# Patient Record
Sex: Male | Born: 1945 | Race: White | Hispanic: No | Marital: Married | Smoking: Former smoker
Health system: Southern US, Community
[De-identification: ages and names within clinical notes are randomized; demographics above are authoritative.]

## PROBLEM LIST (undated history)

## (undated) DIAGNOSIS — I2510 Atherosclerotic heart disease of native coronary artery without angina pectoris: Secondary | ICD-10-CM

## (undated) DIAGNOSIS — I219 Acute myocardial infarction, unspecified: Secondary | ICD-10-CM

## (undated) DIAGNOSIS — H269 Unspecified cataract: Secondary | ICD-10-CM

## (undated) DIAGNOSIS — K219 Gastro-esophageal reflux disease without esophagitis: Secondary | ICD-10-CM

## (undated) DIAGNOSIS — IMO0001 Reserved for inherently not codable concepts without codable children: Secondary | ICD-10-CM

## (undated) DIAGNOSIS — R350 Frequency of micturition: Secondary | ICD-10-CM

## (undated) DIAGNOSIS — R351 Nocturia: Secondary | ICD-10-CM

## (undated) DIAGNOSIS — M1990 Unspecified osteoarthritis, unspecified site: Secondary | ICD-10-CM

## (undated) DIAGNOSIS — R609 Edema, unspecified: Secondary | ICD-10-CM

## (undated) DIAGNOSIS — R200 Anesthesia of skin: Secondary | ICD-10-CM

## (undated) HISTORY — PX: CARDIAC CATHETERIZATION: SHX172

## (undated) HISTORY — DX: Acute myocardial infarction, unspecified: I21.9

## (undated) HISTORY — PX: CARDIAC SURGERY: SHX584

## (undated) HISTORY — PX: OTHER SURGICAL HISTORY: SHX169

## (undated) HISTORY — PX: HERNIA REPAIR: SHX51

## (undated) HISTORY — PX: CORONARY ARTERY BYPASS GRAFT: SHX141

---

## 1999-02-09 ENCOUNTER — Emergency Department (HOSPITAL_COMMUNITY): Admission: EM | Admit: 1999-02-09 | Discharge: 1999-02-10 | Payer: Self-pay | Admitting: Emergency Medicine

## 1999-02-10 ENCOUNTER — Encounter: Payer: Self-pay | Admitting: Emergency Medicine

## 2002-04-16 ENCOUNTER — Encounter: Payer: Self-pay | Admitting: *Deleted

## 2002-04-16 ENCOUNTER — Inpatient Hospital Stay (HOSPITAL_COMMUNITY): Admission: EM | Admit: 2002-04-16 | Discharge: 2002-04-23 | Payer: Self-pay | Admitting: Emergency Medicine

## 2002-04-17 ENCOUNTER — Encounter: Payer: Self-pay | Admitting: Cardiothoracic Surgery

## 2002-04-18 ENCOUNTER — Encounter: Payer: Self-pay | Admitting: Cardiothoracic Surgery

## 2002-04-20 ENCOUNTER — Encounter: Payer: Self-pay | Admitting: Cardiothoracic Surgery

## 2002-04-21 ENCOUNTER — Encounter: Payer: Self-pay | Admitting: Cardiothoracic Surgery

## 2002-04-22 ENCOUNTER — Encounter: Payer: Self-pay | Admitting: Cardiothoracic Surgery

## 2003-07-14 ENCOUNTER — Inpatient Hospital Stay (HOSPITAL_COMMUNITY): Admission: EM | Admit: 2003-07-14 | Discharge: 2003-07-16 | Payer: Self-pay | Admitting: Emergency Medicine

## 2003-08-20 ENCOUNTER — Emergency Department (HOSPITAL_COMMUNITY): Admission: EM | Admit: 2003-08-20 | Discharge: 2003-08-20 | Payer: Self-pay

## 2004-02-14 ENCOUNTER — Emergency Department (HOSPITAL_COMMUNITY): Admission: EM | Admit: 2004-02-14 | Discharge: 2004-02-14 | Payer: Self-pay | Admitting: Emergency Medicine

## 2005-06-02 ENCOUNTER — Encounter: Admission: RE | Admit: 2005-06-02 | Discharge: 2005-06-02 | Payer: Self-pay | Admitting: Internal Medicine

## 2007-12-12 ENCOUNTER — Ambulatory Visit (HOSPITAL_COMMUNITY): Admission: RE | Admit: 2007-12-12 | Discharge: 2007-12-12 | Payer: Self-pay | Admitting: Cardiovascular Disease

## 2009-04-18 ENCOUNTER — Emergency Department (HOSPITAL_COMMUNITY): Admission: EM | Admit: 2009-04-18 | Discharge: 2009-04-19 | Payer: Self-pay | Admitting: Emergency Medicine

## 2010-09-19 NOTE — H&P (Signed)
NAME:  Timothy Cruz, Timothy Cruz                        ACCOUNT NO.:  [id number]   MEDICAL RECORD NO.:  [id number]                   PATIENT TYPE:  INP   LOCATION:  2921                                 FACILITY:  [facility]   PHYSICIAN:  Charlton Haws, M.D. [facility]              DATE OF BIRTH:  09/28/1945   DATE OF ADMISSION:  04/16/2002  DATE OF DISCHARGE:                                HISTORY & PHYSICAL   HISTORY OF PRESENT ILLNESS:  The patient is a 65 year old white male who  presents to the ER with 14 hours of constant substernal chest pressure,  nonradiating, associated with nausea and diaphoresis. It started at 2 p.m.  while he was watching TV.  He describes it as a tightness. He denies any  palpitations, shortness of breath, dizziness or syncope. He has never had  similar chest pain, but he does speak like he had an MI back in 1984,  although catheterization was normal by his report. He does not have a  primary doctor. He does smoke a greater than 50 pack year history. He has a  family history of heart disease in his grandmother.   PAST MEDICAL HISTORY:  1. Questionable MI in 1984.  2. Left inguinal hernia repair in 2000.   MEDICATIONS:  None.   ALLERGIES:  CODEINE.   FAMILY HISTORY:  He says that his mother has some type of heart disease but  he is not sure what, in her early  2s, but never had angioplasty or bypass.   SOCIAL HISTORY:  He is married. He has no children. 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 He does have a greater than 50 pack year  smoking history. He denies any alcohol or drug use.   REVIEW OF SYMPTOMS:  Negative for CNS, negative for GI, negative for GU,  negative for derm, negative for psych. Positive for muscle cramps in his  lower extremities and positive for wearing glasses.   PHYSICAL EXAMINATION:  VITAL SIGNS:  Blood pressure 117/77, heart rate 65,  respiratory rate 18, afebrile; this was after IV Lopressor.  GENERAL:  He is lying  comfortably in bed, in no acute distress.  NECK:  No JVP, no hepatojugular reflux, no carotid bruits.  LUNGS:  Inspiratory crackles bilaterally, approximately half way up with no  presacral edema.  CARDIAC:  His PMI is nondisplaced. He has a normal S1, S2, regular rate and  rhythm, positive soft S4, no murmurs, clicks or rubs appreciated.  ABDOMEN:  Soft, nontender, nondistended, no hepatosplenomegaly.  EXTREMITIES:  There is a decreased right femoral pulse, no bruits, 2+ distal  pulses, cool extremities with no cyanosis, clubbing or edema.  RECTAL:  Guaiac negative brown stool.   LABORATORY DATA:  Currently pending. Creatinine is 1.2.   An EKG  shows normal sinus rhythm, heart rate 70 with Q-waves  anteroseptally. An anterior ST elevation with a peak of  6 mm in V1 through  V4 and 1 and AVL. His chest x-ray shows mild cephalization but no acute  infiltrate.   ASSESSMENT AND PLAN:  This is a 65 year old white male with no significant  past medical history other  than a questionable  MI in 1984, positive  tobacco use, who presents with an  ST elevation MI 14 hours after onset of  chest pain and has Q waves anteroseptally with marked ST elevation. Due to  his persistent pain we will plan on doing an emergent cardiac  catheterization despite it being greater than 12 hours from onset. Will  initially treat with IV Metoprolol, hyper and glycoprotein 2B3A, aspirin,  ACE inhibitor, statin and nitroglycerin. Will start Plavix pending his  catheterization and encourage smoking cessation.                                               Charlton Haws, M.D. [facility]    PN/MEDQ  D:  04/16/2002  T:  04/16/2002  Job:  [id number]

## 2010-09-19 NOTE — Cardiovascular Report (Signed)
NAME:  Timothy Cruz, Timothy Cruz                        ACCOUNT NO.:  [id number]   MEDICAL RECORD NO.:  [id number]                   PATIENT TYPE:  INP   LOCATION:  2921                                 FACILITY:  [facility]   PHYSICIAN:  Arturo Morton. Riley Kill, M.D. [facility]         DATE OF BIRTH:  09/14/1945   DATE OF PROCEDURE:  04/16/2002  DATE OF DISCHARGE:                              CARDIAC CATHETERIZATION   INDICATIONS FOR PROCEDURE:  This patient is a 65 year old gentleman who  presents to the emergency room with an acute anterior wall myocardial  infarction.  He was seen by Dr. Theodis Aguas and subsequently set up for emergent  cardiac catheterization.  He had the onset of chest pain at 2 p.m., and the  chest pain lingered throughout the day. He finally decided to come to the  emergency room shortly after 6 a.m. this morning.  An EKG was diagnostic of  ST elevation and myocardial infarction.   PROCEDURE:  1. Left heart catheterization.  2. Selective coronary arteriography.  3. Selective left ventriculography.  4. Aortic root aortography.  5. Subclavian angiography.   DESCRIPTION OF PROCEDURE:  The patient was brought to the catheterization  laboratory and prepped and draped in the usual fashion.  Through an anterior  puncture, the right femoral artery was easily entered.  A 7 French sheath  was placed.  Views of the left and right coronary artery were then obtained  with multiple angiographic projections.  The central aortic and left  ventricular pressures were measured.  Ventriculography was performed in the  RAO projection.  Following a pressure pull-back, proximal root aortography  was performed.  We then performed subclavian angiography in two views.  Following this, the diagnostic catheters were removed.  A repeat EKG was  obtained to assess ST resolution.  Importantly, the patient had relief of  symptoms and was pain-free at the time of the beginning of the procedure.  EKG revealed some ST  resolution, although not complete.  The catheterization  study did demonstrate TIMI-3 flow in the infarct-related artery.  Because of  the location of the ostial location of the LAD, it was felt that stenting of  the ostium had likely compromised the circumflex take-off.  I then summoned  Dr. Rexanne Mano to the catheterization laboratory from the cardiovascular  and thoracic surgery team, and we reviewed the films.  It was felt that the  best approach would be to recommend revascularization surgery with a fairly  short period of convalescence to allow for some recovery of myocardial  function.  The femoral sheath then was sewn into place after removal of all  catheters.  He was taken to the coronary care unit in satisfactory clinical  condition with resolution of chest pain.  The plan will be for semi-urgent  revascularization surgery.   HEMODYNAMIC DATA:  1. Central aortic pressure 115/80, mean 95.  2. Left ventricle 117/14/27.  3. No gradient and pull-back  across the aortic valve.   ANGIOGRAPHIC DATA:  1. Ventriculography was performed in the RAO projection.  Ejection fraction     was calculated at 37%.  There was near akinesis of the mid and distal     anterolateral wall, apex of the distal inferior wall.  There was trace     mitral regurgitation.  2. The proximal root was injected to rule out dissection and aortic     regurgitation.  The patient had some hang-up of contrast in the left     ventricle during ventriculography, and we wanted to rule out AI in case a     balloon pump was necessary and also as a preoperative measure.  No aortic     regurgitation was noted.  3. The left main coronary artery has about 20% segmental plaquing at the     ostium, extending up to just before the bifurcation. The vessel then     opens up.  4. The left anterior descending artery courses to the apex.  There are two     diagonal branches.  There is a high-grade ostial stenosis with some      haziness, compatible with thrombus at the ostium of the LAD.  This was     95% blocked.  There was TIMI-3 flow.  There was a tiny first diagonal     with about 80% eccentric narrowing and a second diagonal which has about     60% ostial narrow.  The mid-AD is somewhat segmentally plaqued with about     40-50% narrowing at its first point. The distal vessel does appear to be     suitable for grafting.  5. There is a tiny ramus intermedius vessel that has some segmental plaquing     at the ostium.  6. The circumflex proper provides two large marginal branches.  The first     one has a 50% ostial eccentric stenosis.  The second one in the AV     portion of the vessel has a 50% segmental stenosis.  7. The right coronary artery is a dominant vessel, providing a posterior     descending and posterolateral system. There is minimal luminal     irregularity but no focal area stenosis in the RCA.  8. The subclavian demonstrates some folding in the midportion of the     subclavian prior to the vertebral and mammary takeoff.  This does not     appear to more than about 40-50%.  There is a good runoff into the distal     vessel.  The mammary itself appears to be widely patent.   CONCLUSIONS:  1. Moderate reduction of left ventricular function with an anterior wall     motion abnormality.  2. 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  3. Other findings as noted above.   DISPOSITION:  Dr. Laneta Simmers and I have reviewed the films together in detail.  Because of the ostial location of the LAD stenosis, it was felt that  stenting would likely compromise the circumflex takeoff.  Given the anatomic  findings, our leaning is in the direction of consideration of revascularization surgery.  Dr. Laneta Simmers is seeing the patient, and plans will  be made for semi-urgent  revascularization surgery after a period of  myocardial stabilization, as long as the patient continues  to be pain-free  and without new ST elevation.                                               Arturo Morton. Riley Kill, M.D. [facility]    TDS/MEDQ  D:  04/16/2002  T:  04/16/2002  Job:  [id number]

## 2010-09-19 NOTE — Discharge Summary (Signed)
NAME:  Timothy Cruz, Timothy Cruz                        ACCOUNT NO.:  [id number]   MEDICAL RECORD NO.:  [id number]                   PATIENT TYPE:  INP   LOCATION:  2023                                 FACILITY:  [facility]   PHYSICIAN:  Gwenith Daily. Tyrone Sage, M.D.            DATE OF BIRTH:  05/17/1945   DATE OF ADMISSION:  04/16/2002  DATE OF DISCHARGE:  04/23/2002                                 DISCHARGE SUMMARY   ADMISSION DIAGNOSIS:  Acute anterior myocardial infarction.   SECONDARY DIAGNOSES:  1. Postoperative admission secondary to blood loss  2. Right-sided pneumothorax.   DISCHARGE DIAGNOSIS:  Coronary artery disease.   HOSPITAL COURSE:  The patient was admitted to [facility] on April 16, 2002, after  experiencing acute anterior myocardial infarction. He underwent cardiac  catheterization at this time. Dr. Tyrone Sage was subsequently consulted. On  April 18, 2002, Dr. Tyrone Sage performed a coronary bypass graft x 4 with  left internal mammary artery anastomosis, left anterior descending artery  radiating up to the diagonal artery and sequential saphenous vein graft to  the first and second obtuse marginal arteries. No complications are noted  during procedure. Postoperatively the patient was anticoagulated with  Coumadin secondary to his anterior myocardial infarction.  His postoperative  course was complicated by 60% right-sided pneumothorax following his  mediastinal chest tube removal. Because of this, Dr. Tyrone Sage performed  reinsertion of a #28 French right-sided chest tube. Chest tube is  discontinued two days later after chest x-ray revealed he had no  pneumothorax, and he had no air leak on water seal. The remainder of his  postoperative course was uneventful, and he was subsequently deemed stable  for discharge home on April 23, 2002.   DISCHARGE MEDICATIONS:  1. Altace 2.5 mg twice daily.  2. Toprol XL 25 mg one daily.  3. Pepcid 20 mg 1 twice daily.  4.  Ultram 50 mg 1-2 tabs every 4-6 hours for pain.  5. Coumadin 5 mg. The patient will take one daily or as directed by his     cardiologist.   DISCHARGE ACTIVITIES:  The patient was told no driving, strenuous activity, lifting heavy objects.   DIET:  Low fat, low salt.   DISCHARGE INSTRUCTIONS:  The patient was told he could shower and clean the incision with soap and  water.   DISPOSITION/FOLLOWUP:  The patient was told to call his cardiologist, Dr.  Corinda Gubler, for a two-week followup appointment. In addition, he is told to  have his INR checked in 2 to 3 days from discharge by his cardiologist. He  is also to see Dr. Tyrone Sage at his office on Thursday, February 5 at 11:10  a.m.  Told to bring chest x-ray with him.     Levin Erp. Steward, P.A.                      Gwenith Daily Tyrone Sage, M.D.  BGS/MEDQ  D:  04/22/2002  T:  04/24/2002  Job:  [id number]

## 2010-09-19 NOTE — Consult Note (Signed)
NAME:  Timothy Cruz, Timothy Cruz                        ACCOUNT NO.:  [id number]   MEDICAL RECORD NO.:  [id number]                   PATIENT TYPE:  INP   LOCATION:  3011                                 FACILITY:  [facility]   PHYSICIAN:  Casimiro Needle L. Thad Ranger, M.D.           DATE OF BIRTH:  12-27-1945   DATE OF CONSULTATION:  07/15/2003  DATE OF DISCHARGE:  07/16/2003                                   CONSULTATION   PRIMARY CARE PHYSICIAN:  Joycelyn Rua, M.D.   REASON FOR EVALUATION:  Visual changes on left side.   HISTORY OF PRESENT ILLNESS:  This is the initial inpatient consultation  evaluation of this 65 year old male with medical history including coronary  artery disease.  Admitted for workup of transient focal neurologic symptoms.  The patient reports that on Friday evening he was sitting in a cafe with his  wife, when he says that his vision got blurry.  He apparently had initially  reported that vision was blurry out of both eyes; however, he tells me very  distinctly today that the vision became blurry in his left eye only.  In any  case, he went to wash his eyes out with water and says that his vision  actually cleared up after a few minutes.  There was no associated weakness,  numbness or pain with this. He subsequently left the restaurant with this  wife and on the way home developed a numb sensation involving the entire  left upper extremity, sparing the face and lower extremity.  There was no  associated and he denied any associated headache or return of the visual  changes.  At that point he came to the emergency room, by which time the  left arm numbness had resolved.  However, by this time he had developed a  headache, which he described as throbbing in nature and located in the  front; describing it as like my eyes coming out of my head.  He has  specifically had these headaches before.  He is subsequently admitted for  evaluation and further workup, including MRI and MRA (which  was performed  today) and is available for my review.  He denied any further neurologic  symptoms in the hospital.  He has, however, on each of the last two days had  a headache similar in character to the initial event, which has been brief  and relatively well relieved by Tylenol.  Neurological consultation is  requested for further considerations about all of events.   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  He has a history of hypercholesterolemia  (on Lipitor).  On questioning he does report a nearly lifelong history of  severe headaches, which are throbbing in character, bifrontal, lasting 2-3  hr, fairly severe, sometimes associated with photophobia and often relieved  by vomiting.   FAMILY, SOCIAL AND REVIEW OF SYSTEMS:  As outlined in the initial H&P by  [name], M.D. and in the admission nursing records.   MEDICATIONS:  Prior to admission he was taking aspirin, Altace, Lipitor,  Toprol XL, Pepcid AC, Omega 3 fish oil.  In the hospital Bextra and Toprol  have been discontinued, the latter due to bradycardia.  The aspirin has been  changed to Plavix.   ALLERGIES:  CODEINE.   PHYSICAL EXAMINATION:  VITAL SIGNS:  Temperature 97.2, blood pressure  134/82, pulse 65, respirations 20, O2 saturations 98% on room air.  GENERAL EXAMINATION:  This is healthy-appearing man, lying supine on the  hospital bed in no evident distress.  HEENT:  Cranium is normocephalic and atraumatic.  Oropharynx is benign.  NECK:  Supple without carotid bruits.  HEART:  Regular rate and rhythm without murmurs.  NEUROLOGIC:  Mental Status:  He is awake, alert and oriented to time, place  and person.  Recent and remote memory are intact.  Attention span,  concentration and fund of knowledge are appropriate.  The patient is fluent  and not dysarthric.  Mood is euthymic and affect appropriate.  Pupils equal  and physically  active.  Extraocular movements are full without nystagmus.  Visual fields are full to confrontation.  Face, tongue and palate move  normally and symmetrically.  The patient's face is sensitive to pinprick.  Shoulder shrug strength is normal.  Motor and sensory are normal bulk and  tone.  Normal strength in all extremity muscles.  Sensation intact to  pinprick and light touch in all extremities.  Coordination of rapid  movements are performed well.  Heel-to-shin and finger-to-nose were  performed well.  Gait:  He arises from a chair easily and his stance is  normal.  He is able to heel-toe and tandem walk without difficulty.  Reflexes are 2+ and symmetric.  Toes are downgoing.   LABORATORY REVIEW:  Admission CBC is unremarkable.  BMET x2 is unremarkable.  Fasting homocysteine level from this morning is in the normal range at  11.09.  Fasting lipid panel is pending at this time.   MRI and MRA of the brain performed earlier today is personally reviewed.  The MRI of the brain is basically entirely unremarkable.  The MRA does not  demonstrate a significant vascular disease.  There is minimal left siphon  disease versus artifact, but nothing critical.   IMPRESSION:  1. Transient neurologic symptoms of visual alteration and left hemi     numbness.  This, very likely, was a migraine and given that he does     clearly have migraine history.  However, in a patient with known coronary     artery disease, TIA must also be a strong consideration.  2. Common migraine, see above.  3. Noncritical left carotid disease.  4. History of coronary artery disease.  5. History of hyperlipidemia.   RECOMMENDATIONS:  Carotid Doppler and echocardiogram have been ordered.  Will also order transcranial dopplers and MRA of the neck to evaluate the  vertebral arteries.  He would also benefit from a migraine prophylactic; a  beta blocker would be ideal but his beta blocker at present has actually been held due to  bradycardia.  Will consider starting an anticonvulsant such  as Depakote and Topamax.  Will follow with these.  Michael L. Thad Ranger, M.D.    MLR/MEDQ  D:  07/15/2003  T:  07/17/2003  Job:  [id number]   cc:   Joycelyn Rua, M.D.  [address] [place]  [place] [address]  Fax: [phone]

## 2010-09-19 NOTE — Discharge Summary (Signed)
NAME:  Timothy Cruz, Timothy Cruz                        ACCOUNT NO.:  [id number]   MEDICAL RECORD NO.:  [id number]                   PATIENT TYPE:  INP   LOCATION:  3011                                 FACILITY:  [facility]   PHYSICIAN:  [name], M.D.             DATE OF BIRTH:  1946/01/07   DATE OF ADMISSION:  07/14/2003  DATE OF DISCHARGE:  07/16/2003                                 DISCHARGE SUMMARY   DISCHARGE DIAGNOSES:  1. Transient focal neurologic symptoms presumed secondary to complicated     migraine.  2. History of headaches.  3. History of coronary artery disease, status post coronary artery bypass     graft.  4. History of gastroesophageal reflux.  5. Dyslipidemia.   DISCHARGE MEDICATIONS:  1. The patient is going to resume his preadmission medications which     included:  a  Toprol.  b  Pepcid.  c  Altace.  d  Aspirin.  e.  His dose of Lipitor has been increased from 20 mg daily to 30 mg daily.  1. He will be using over-the-counter Tylenol for pain control; this seems to     be working for him for his occipital headache so far as needed.  2. A new medicine is Zofran 4 mg every 6 hours as needed for nausea.   ACTIVITY:  Activity is as tolerated.   DIET:  Diet will be a cardiac diet.   FOLLOWUP:  The patient is to follow up with Dr. Janalyn Shy P. Sethi in 3-4 weeks  as planned.   DISCHARGE LABORATORIES:  WBC count 7.6, hemoglobin 14.6, hematocrit 43.0,  platelet count 245,000, MCV 85.6.  Sodium 134, potassium 4.2, chloride 108,  CO2 27, glucose 97, BUN 10, creatinine 1.1, calcium 8.5, total cholesterol  175, triglycerides 112, HDL 13, LDL 123.  Homocysteine level is 11.09.   REASON FOR HOSPITALIZATION:  Acute visual change with left hand numbness, to  rule out posterior stroke; transient ischemic attack, rule out posterior  circulation stroke.   The patient presented with acute left eye visual blurring with numbness of  his left hand the day prior to admission,  lasting no more than 15 minutes,  with severe occipital headaches and some nausea.  At the ED, the patient had  a CT scan done which was negative.  The ED exam did not reveal any focal  deficit and we were called to admit the patient for evaluation for possible  transient ischemic attack.  At time of admission, the patient's hemodynamics  were within normal limits.  He was not in acute cardiopulmonary distress.  He was alert and oriented x3 without any acute focal deficit.  His lungs  were clear to auscultation.  Cardiac exam was notable for regular rate and  rhythm without any gallops or murmur and there was no edema on exam of his  extremities.  Admitting CBC was within normal limits and chemistries were  also within normal limits.  EKG was notable for sinus bradycardia at 54  beats per minute without any acute ST wave changes.  The patient was  therefore admitted for probable TIA to rule out posterior circulation  stroke.   HOSPITAL COURSE:  The patient was admitted to telemetry monitoring.  There  were no significant consistent dysrhythmias.  He had a mild bradycardic  episode 24 hours of admission, which was during the time of sleep and was  not symptomatic, and resolved spontaneously.  The patient was switched from  aspirin to Plavix and MRI was obtained which was negative for any acute  stroke.  MRA indicated some mild left internal carotid artery stenosis.  Homocysteine level was done which was within normal limits.  Carotid Doppler  was also done which showed bilateral mild plaque noted throughout without  any internal carotid artery stenosis.  The patient, in the hospital,  continued to have some severe occipital headaches which resolved with  Tylenol.  The patient was seen in consultation by Dr. Kelli Hope of  neurology yesterday and his impression was that these symptoms were related  to complicated migraines, however, he thought that TIA might be strongly  considered.   He recommended neck MRA and transcranial Dopplers.  Today, the  patient was seen with Dr. Sunny Schlein. Sethi at bedside, who had reviewed the  MRA, and the patient is deemed appropriate for discharge today and Dr. Pearlean Brownie  thinks that the patient does not need any further stroke workup and he  strongly believes that the patient's symptoms were related to complicated  migraines.  He also noted that this patient had been having headaches like  once every month or so.  The patient does not need any migraine prophylaxis  for now and that he was going to see the patient in the office in about a  month and follow him up in this regard.  He, however, recommended increasing  the dose of his Lipitor from 20 mg to 30 mg daily.  Two-dimensional echo was  ordered as part of the workup for stroke on admission and it was not done  prior to discharge and it is deemed unnecessary at this point.  Today, at  rounds with Dr. Pearlean Brownie, the patient complains of some nausea, headache is  improved, he does not have any chest pain, shortness of breath,  palpitations, dyspnea on exertion, paroxysmal nocturnal dyspnea or  orthopnea.  His BP was 129/80, pulse rate of 75, respiratory rate of 20,  temperature of 98.2 degrees Fahrenheit, saturation of 94% on room air.  On  general examination, he was not in acute cardiopulmonary distress.  On  neurologic exam, pupils were equal, round and reactive to light.  Extraocular movements were intact.  There were no visual deficits and  neurologically intact.  Neck was supple with no JVD.  Lungs were clear to  auscultation bilaterally.  Cardiac exam was notable for regular rate and  rhythm without any gallops or murmur.  His telemetry showed sinus rhythm  overnight.  He does not have any edema of his extremities.  Abdomen is soft  and nontender.  Normoactive bowel sounds were appreciated.  The patient is going to be discharged home today in satisfactory condition.   DISCHARGE PLANS AND  INSTRUCTIONS:  We have opted to give him Zofran for  symptomatic control of headache-associated nausea.  The patient is to follow  up with his primary care physician as stated, routinely.  He is going  to  follow up with Dr. Pearlean Brownie, as mentioned above, for management of his  migraines and I have asked him to return to the office or to notify his M.D.  should he have any problems including any persistent visual changes or  extremity weakness.                                                [name], M.D.    GO/MEDQ  D:  07/16/2003  T:  07/18/2003  Job:  [id number]   cc:   Casimiro Needle L. Thad Ranger, M.D.  [address]  [address]  [place]  [place] [address]  Fax: [phone]   Pramod P. Pearlean Brownie, MD  Fax: [phone]   Joycelyn Rua, M.D.  [address] [place]  [place] [address]  Fax: [phone]

## 2010-09-19 NOTE — Op Note (Signed)
NAME:  Timothy Cruz, Timothy Cruz                        ACCOUNT NO.:  [id number]   MEDICAL RECORD NO.:  [id number]                   PATIENT TYPE:  INP   LOCATION:  2023                                 FACILITY:  [facility]   PHYSICIAN:  Gwenith Daily. Tyrone Sage, M.D.            DATE OF BIRTH:  01-27-1946   DATE OF PROCEDURE:  04/18/2002  DATE OF DISCHARGE:                                 OPERATIVE REPORT   PREOPERATIVE DIAGNOSIS:  Acute anterior myocardial infarction and coronary  occlusive disease.   POSTOPERATIVE DIAGNOSIS:  Acute anterior myocardial infarction and coronary  occlusive disease.   PROCEDURES:  1. Coronary artery bypass grafting x4 with the left internal mammary artery     to the left anterior descending coronary artery, reversed saphenous vein     graft to the diagonal coronary artery, sequential reversed saphenous vein     graft to the first and second obtuse marginals.  2. Right endovein harvesting.   SURGEON:  Gwenith Daily. Tyrone Sage, M.D.   ASSESSMENT:  Timothy Cruz, P.A.   BRIEF HISTORY:  The patient is a 65 year old male with a strong smoking  history, who presents with an episode of prolonged chest pain.  At the time  of his admission, he had EKG changes and troponin and CK-MB consistent with  myocardial infarction.  He was stabilized medically and underwent cardiac  catheterization by Arturo Morton. Riley Kill, M.D., which demonstrated a high-grade  diffuse, long stenosis of the proximal LAD and diagonal branch.  In  addition, there was 60% stenosis of the first and second obtuse marginals.  The right coronary artery had luminal irregularities but without high-grade  stenosis.  Because of the patient's critical anatomy and anatomy unfavorable  for successful angioplasty, coronary artery bypass grafting was recommended.  The patient agreed and signed informed consent.   DESCRIPTION OF PROCEDURE:  With Swan-Ganz and arterial line monitors in  place, the patient underwent general  endotracheal anesthesia without  incident.  The skin of the chest and legs was prepped with Betadine and  draped in the usual sterile manner.  Endovein was harvested from the right  thigh but was very small and not suitable for bypass.  Additional vein was  harvested from each lower extremity and was of good quality and caliber.  Median sternotomy was performed.  The left internal mammary artery was  dissected down as a pedicle graft.  The distal artery had good, free flow.  The pericardium was opened.  The patient had evidence of LV dysfunction with  hypokinesis of the anterior ventricular wall.  He was systemically  heparinized.  The ascending aorta and the right atrium were cannulated, and  the aortic root vent cardioplegia needle was introduced into the ascending  aorta.  The patient was placed on cardiopulmonary bypass, 2.4 L/min. per sq.  m.  Sites of anastomosis were dissected out of the epicardium.  The  patient's body temperature was cooled to  30 degrees, the aortic crossclamp  was applied, and 500 cc of cold blood potassium cardioplegia was  administered with rapid diastolic arrest of the heart.  Myocardial septal  temperature was monitored throughout the crossclamp period.  Attention was  turned first to the first and second obtuse marginal vessels.  Each was  opened and admitted a 1.5 mm probe.  Using a diamond-type side-to-side  anastomosis, a segment of reversed saphenous vein was anastomosed to the  first obtuse marginal.  The distal extent of the same vein was then carried  a short distance to the second obtuse marginal, where an end-to-side  anastomosis was carried out with a running 7-0 Prolene.  Attention was then  turned to the diagonal coronary artery, which was opened and was 1.3-1.4 mm  in size.  Using a running 7-0 Prolene, distal anastomosis was performed to  the second reversed saphenous vein graft.  The left anterior descending  coronary artery was then opened in  the mid- to distal portion.  It admitted  a 1.5 mm probe proximally and distally.  Using a running 8-0 Prolene, the  left internal mammary artery was anastomosed to the left anterior descending  coronary artery.  With release of the Edwards bulldog on the mammary artery,  there was appropriate rise in myocardial septal temperature, and the aortic  crossclamp was removed.  Total crossclamp time 53 minutes.  The patient  required electrical defibrillation and returned to a sinus rhythm.  A  partial occlusion clamp was placed on the ascending aorta and two punch  aortotomies were performed.  Each of the two vein grafts were anastomosed to  the ascending aorta.  Air was evacuated from the grafts, and the partial  occlusion clamp was removed.  Sites of anastomosis were inspected and were  free of bleeding.  The patient was started on milrinone infusion and  dopamine infusion, and he was then weaned from cardiopulmonary bypass  without difficulty.  He remained hemodynamically stable.  He was  decannulated in the usual fashion.  Protamine sulfate was administered.  With the operative field hemostatic, two atrial and two ventricular pacing  wires were applied.  With the operative field hemostatic, the pericardium  was loosely reapproximated.  The sternum was closed with #6 stainless steel  wire.  The fascia was closed with interrupted 0 Vicryl, running 3-0 Vicryl  for the subcutaneous tissue, and 4-0 subcuticular stitch in the skin edges.  Two mediastinal tubes had been left in place and a left pleural tube.  The  patient was then transferred to the surgical intensive care unit, having  tolerated the procedure without obvious complications                                                Edward B. Tyrone Sage, M.D.    Tyson Babinski  D:  04/21/2002  T:  04/23/2002  Job:  [id number]   cc:   Arturo Morton. Riley Kill, M.D. [facility]  [address]  [place]  [place] [address]  Fax: [phone]

## 2010-09-19 NOTE — Consult Note (Signed)
NAME:  Timothy Cruz, Timothy Cruz                        ACCOUNT NO.:  [id number]   MEDICAL RECORD NO.:  [id number]                   PATIENT TYPE:  INP   LOCATION:  2921                                 FACILITY:  [facility]   PHYSICIAN:  Evelene Croon, M.D.                  DATE OF BIRTH:  1946/01/20   DATE OF CONSULTATION:  04/16/2002  DATE OF DISCHARGE:                                   CONSULTATION   REASON FOR CONSULTATION:  Severe two vessel coronary artery disease, status  post acute anterior myocardial infarction.   HISTORY OF PRESENT ILLNESS:  This patient is a 65 year old white male with  multiple cardiac risk factors who has a questionable history of myocardial  infarction in 1984 with reportedly negative cath who was admitted earlier  this morning with a 14-hour history of constant substernal chest pain  associated with nausea and diaphoresis.  He said the pain began about 2 p.m.  yesterday while watching TV and he described it was a tightness that was  moderately severe.  On admission this morning he was noted to have  anterolateral ST elevation.  His CPK was 500 with an MB fraction of 52.5  with a troponin level of 2.42.  He was taken to the catheterization lab  where he was pain free on arrival.  Catheterization showed a 95% ostial LAD  stenosis with TIMI-III flow.  There is a diagonal branch with about 60%  ostial  stenosis.  The LAD also had about 40-50% mid vessel stenosis.  The  left circumflex had 50% first obtuse marginal and 50% mid stenosis before  the second obtuse marginal.  The left main was irregular with about 20%  stenosis.  The right coronary artery had no disease.  The left subclavian  had about 40-50% narrowing before the takeoff of the left internal mammary  artery.  Ejection fraction was about 37% with anterior apical severe  hypokinesis.  There was no gradient across the aortic valve.  There was  trace mitral regurgitation.  The patient was started on heparin  and  Integrelin and transported to the coronary care unit.  At the present time  he has very mild substernal chest pressure on low dose nitroglycerin.   PAST MEDICAL HISTORY:  Significant for questionable myocardial infarction in  1984 as mentioned above.  He denies a history of hypertension,  hypercholesterolemia, and diabetes.  He is status post left inguinal hernia  repair in 2000.   MEDICATIONS:  None.   ALLERGIES:  CODEINE.   REVIEW OF SYSTEMS:  GENERAL:  He denies fever or chills.  He has had no  recent weight change.  ENDOCRINE:  Denies diabetes and hypothyroidism.  CARDIOVASCULAR:  As above.  He has had no peripheral edema.  He denies any  shortness of breath.  He has had no palpitations.  He denies PND or  orthopnea.  RESPIRATORY:  He denies  cough and sputum production.  GI:  He  has had no nausea or vomiting.  He denies melena or bright red blood per  rectum.  GU:  He denies history of hematuria.  MUSCULOSKELETAL:  He denies  arthralgias and myalgias.  He does get night cramps in his calves.  PSYCHIATRIC:  Negative.  NEUROLOGIC:  He has never had focal weakness or  numbness.  Denies dizziness or syncope.   FAMILY HISTORY:  Positive for coronary artery disease.   SOCIAL HISTORY:  He is married and has no children.  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  It is fairly strenuous work.  He has over  a 50-pack-year smoking history and smokes about one pack of cigarettes per  day.  He denies alcohol abuse.   PHYSICAL EXAMINATION:  VITAL SIGNS:  Blood pressure 150/90, pulse 75 and  regular, respiratory rate 15 and unlabored.  GENERAL:  He is a well-developed black male in no distress.  HEENT:  Appears to be normocephalic and atraumatic.  The patient pupils are  equal and reactive to light and accommodation.  Extraocular muscles are  intact.  Sclerae clear.  NECK:  Normal carotid pulses bilaterally.  There are no bruits.  There is no  adenopathy or  thyromegaly.  CARDIAC:  Regular rate and rhythm with a normal S1 and S2.  There is no  murmur, rub or gallop.  LUNGS:  Clear.  ABDOMEN:  Active bowel sounds.  His abdomen is soft, flat and nontender.  There are no palpable masses or organomegaly.  EXTREMITIES:  No peripheral edema.  Pedal pulses are palpable bilaterally.  NEUROLOGIC:  Alert and oriented x 3.  Motor and sensory exam is grossly  normal.  SKIN:  Warm and dry.   IMPRESSION:  The patient has severe two vessel coronary disease with a high  grade ostial left anterior descending stenosis and acute anterior myocardial  infarction that is about 24 hours old.  I agree that coronary artery bypass  graft surgery is the best treatment to prevent further ischemia and  infarction.  As long as he remains pain free we would like to let him wait  about 48 hours to allow his heart to recover some from this myocardial  infarction.  If he has recurrent chest pain on intravenous anticoagulation  and nitroglycerin then we will need to proceed as an emergency.  I discussed  the operative procedure with the patient and his wife including  alternatives, benefits, and risks including bleeding, blood transfusion,  infection, stroke, myocardial infarction, graft failure, and death.  They  understand and agree to proceed.                                               Evelene Croon, M.D.    BB/MEDQ  D:  04/16/2002  T:  04/17/2002  Job:  [id number]   cc:   [facility] Cardiology Office

## 2013-05-19 ENCOUNTER — Ambulatory Visit
Admission: RE | Admit: 2013-05-19 | Discharge: 2013-05-19 | Disposition: A | Discharge status: Home / Self Care | Payer: Medicare HMO | Source: Ambulatory Visit | Attending: Internal Medicine | Admitting: Internal Medicine

## 2013-05-19 ENCOUNTER — Other Ambulatory Visit: Payer: Self-pay | Admitting: Internal Medicine

## 2013-05-19 DIAGNOSIS — T1490XA Injury, unspecified, initial encounter: Secondary | ICD-10-CM

## 2013-07-04 ENCOUNTER — Other Ambulatory Visit: Payer: Self-pay | Admitting: Internal Medicine

## 2013-07-04 DIAGNOSIS — I2510 Atherosclerotic heart disease of native coronary artery without angina pectoris: Secondary | ICD-10-CM

## 2013-07-11 ENCOUNTER — Ambulatory Visit
Admission: RE | Admit: 2013-07-11 | Discharge: 2013-07-11 | Disposition: A | Discharge status: Home / Self Care | Payer: Medicare HMO | Source: Ambulatory Visit | Attending: Internal Medicine | Admitting: Internal Medicine

## 2013-07-11 DIAGNOSIS — I2510 Atherosclerotic heart disease of native coronary artery without angina pectoris: Secondary | ICD-10-CM

## 2015-03-20 NOTE — Progress Notes (Signed)
Patient ID: Timothy LorRobert E Siefken, male   DOB: 12-16-1945, 69 y.o.   MRN: [id number]     Cardiology Office Note   Date:  03/21/2015   ID:  Timothy LorRobert E Delucia, DOB 12-16-1945, MRN [id number]  PCP:  Ralene OkMOREIRA,ROY, MD  Cardiologist:   Charlton HawsPeter Nishan, MD   Chief Complaint  Patient presents with  . New Evaluation    CAD      History of Present Illness: Timothy Cruz is a 69 y.o. male who presents for evaluation of CAD.  History of CABG 2003 by Dr Tyrone SageGerhardt  Presented with anterior MI.  Dr Riley KillStuckey felt ostial nature Of LAD disease warranted surgery  EF at time of presentatoin was 37%   04/18/2002 PREOPERATIVE DIAGNOSIS: Acute anterior myocardial infarction and coronary occlusive disease.  POSTOPERATIVE DIAGNOSIS: Acute anterior myocardial infarction and coronary occlusive disease.  PROCEDURES: 1. Coronary artery bypass grafting x4 with the left internal mammary artery  to the left anterior descending coronary artery, reversed saphenous vein  graft to the diagonal coronary artery, sequential reversed saphenous vein  graft to the first and second obtuse marginals. 2. Right endovein harvesting.  SURGEON: Gwenith DailyEdward B. Tyrone SageGerhardt, M.D.  Has severe phymosis and may need circumcision Referred by Dr Laverle PatterBorden for cardiac clearance  He is overweight and somewhat sedentary.  He does help  A neighbor bale hay no angina  Compliant with meds    Past Medical History  Diagnosis Date  . MI (myocardial infarction) [facility](HCC)     Past Surgical History  Procedure Laterality Date  . Cardiac surgery    . Hernia repair    . Oral tooth surgery       Current Outpatient Prescriptions  Medication Sig Dispense Refill  . aspirin 81 MG tablet Take 243 mg by mouth daily.    Marland Kitchen. augmented betamethasone dipropionate (DIPROLENE-AF) 0.05 % cream Apply 1 application topically 2 (two) times daily.    . Calcium Carb-Cholecalciferol (CALCIUM 600/VITAMIN D3 PO) Take 1 tablet by mouth daily.    Marland Kitchen.  olmesartan-hydrochlorothiazide (BENICAR HCT) 20-12.5 MG tablet Take 1 tablet by mouth daily.    . Omega-3 Fatty Acids (FISH OIL) 1000 MG CAPS Take 2 capsules by mouth daily.    . rosuvastatin (CRESTOR) 20 MG tablet Take 20 mg by mouth daily.  2   No current facility-administered medications for this visit.    Allergies:   Codeine    Social History:  The patient  reports that he has quit smoking. He does not have any smokeless tobacco history on file. He reports that he does not drink alcohol or use illicit drugs.   Family History:  The patient's family history includes Heart attack in his father and mother.    ROS:  Please see the history of present illness.   Otherwise, review of systems are positive for none.   All other systems are reviewed and negative.    PHYSICAL EXAM: VS:  BP 122/76 mmHg  Pulse 84  Ht 6\' 1"  (1.854 m)  Wt 110.224 kg (243 lb)  BMI 32.07 kg/m2 , BMI Body mass index is 32.07 kg/(m^2). Affect appropriate Overweight white male  HEENT: normal Neck supple with no adenopathy JVP normal no bruits no thyromegaly Lungs clear with no wheezing and good diaphragmatic motion Heart:  S1/S2 no murmur, no rub, gallop or click PMI normal Abdomen: benighn, BS positve, no tenderness, no AAA no bruit.  No HSM or HJR Distal pulses intact with no bruits No edema Neuro non-focal Skin warm and  dry No muscular weakness    EKG:   SR rate 84  Voltage LVH limb leads poor R wve progression no acute change s   Recent Labs: No results found for requested labs within last 365 days.    Lipid Panel No results found for: CHOL, TRIG, HDL, CHOLHDL, VLDL, LDLCALC, LDLDIRECT    Wt Readings from Last 3 Encounters:  03/21/15 110.224 kg (243 lb)      Other studies Reviewed: Additional studies/ records that were reviewed today include: [facility] notes Dr Laverle Patter.    ASSESSMENT AND PLAN:  1. 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 Chol:  On statin labs with primary Dr Ludwig Clarks   Current medicines are reviewed at length with the patient today.  The patient does not have concerns regarding medicines.  The following changes have been made:  no change  Labs/ tests ordered today include: Lexiscan myovue and echo    Orders Placed This Encounter  Procedures  . Myocardial Perfusion Imaging  . EKG 12-Lead  . ECHOCARDIOGRAM COMPLETE     Disposition:   FU with me 3 months post op if tests normal      Signed, Charlton Haws, MD  03/21/2015 2:48 PM    [facility] [address] [place], [place], [place]  [address] Phone: [phone]; Fax: [phone]

## 2015-03-21 ENCOUNTER — Encounter: Payer: Self-pay | Admitting: Cardiovascular Disease

## 2015-03-21 ENCOUNTER — Ambulatory Visit (INDEPENDENT_AMBULATORY_CARE_PROVIDER_SITE_OTHER): Payer: Medicare HMO | Admitting: Cardiovascular Disease

## 2015-03-21 VITALS — BP 122/76 | HR 84 | Ht 73.0 in | Wt 243.0 lb

## 2015-03-21 DIAGNOSIS — Z01818 Encounter for other preprocedural examination: Secondary | ICD-10-CM | POA: Diagnosis not present

## 2015-03-21 DIAGNOSIS — I255 Ischemic cardiomyopathy: Secondary | ICD-10-CM | POA: Diagnosis not present

## 2015-03-21 NOTE — Patient Instructions (Signed)
Medication Instructions:  Your physician recommends that you continue on your current medications as directed. Please refer to the Current Medication list given to you today.  Labwork: NONE  Testing/Procedures: Your physician has requested that you have a lexiscan myoview. For further information please visit [email or web address]. Please follow instruction sheet, as given.   Your physician has requested that you have an echocardiogram. Echocardiography is a painless test that uses sound waves to create images of your heart. It provides your doctor with information about the size and shape of your heart and how well your heart's chambers and valves are working. This procedure takes approximately one hour. There are no restrictions for this procedure.  Follow-Up: Your physician recommends that you schedule a follow-up appointment in:   3 MONTHS  WITH   DR Eden EmmsNISHAN  Any Other Special Instructions Will Be Listed Below (If Applicable).     If you need a refill on your cardiac medications before your next appointment, please call your pharmacy.

## 2015-03-26 ENCOUNTER — Other Ambulatory Visit: Payer: Self-pay

## 2015-03-26 ENCOUNTER — Ambulatory Visit (HOSPITAL_COMMUNITY): Payer: Medicare HMO | Attending: Cardiovascular Disease

## 2015-03-26 ENCOUNTER — Telehealth (HOSPITAL_COMMUNITY): Payer: Self-pay

## 2015-03-26 DIAGNOSIS — I2510 Atherosclerotic heart disease of native coronary artery without angina pectoris: Secondary | ICD-10-CM | POA: Insufficient documentation

## 2015-03-26 DIAGNOSIS — I252 Old myocardial infarction: Secondary | ICD-10-CM | POA: Insufficient documentation

## 2015-03-26 DIAGNOSIS — I5189 Other ill-defined heart diseases: Secondary | ICD-10-CM | POA: Insufficient documentation

## 2015-03-26 DIAGNOSIS — I255 Ischemic cardiomyopathy: Secondary | ICD-10-CM | POA: Insufficient documentation

## 2015-03-26 DIAGNOSIS — Z951 Presence of aortocoronary bypass graft: Secondary | ICD-10-CM | POA: Insufficient documentation

## 2015-03-26 NOTE — Telephone Encounter (Signed)
Patient given detailed instructions per Myocardial Perfusion Study Information Sheet for the test on 04-02-2015 at 0715. Patient notified to arrive 15 minutes early and that it is imperative to arrive on time for appointment to keep from having the test rescheduled.  If you need to cancel or reschedule your appointment, please call the office within 24 hours of your appointment. Failure to do so may result in a cancellation of your appointment, and a $50 no show fee. Patient verbalized understanding.Agostino Gorin A    

## 2015-04-02 ENCOUNTER — Ambulatory Visit (HOSPITAL_COMMUNITY): Payer: Medicare HMO | Attending: Cardiovascular Disease

## 2015-04-02 DIAGNOSIS — I517 Cardiomegaly: Secondary | ICD-10-CM | POA: Insufficient documentation

## 2015-04-02 DIAGNOSIS — R9439 Abnormal result of other cardiovascular function study: Secondary | ICD-10-CM | POA: Diagnosis not present

## 2015-04-02 DIAGNOSIS — Z01818 Encounter for other preprocedural examination: Secondary | ICD-10-CM | POA: Insufficient documentation

## 2015-04-02 DIAGNOSIS — R0609 Other forms of dyspnea: Secondary | ICD-10-CM | POA: Diagnosis not present

## 2015-04-02 DIAGNOSIS — I2510 Atherosclerotic heart disease of native coronary artery without angina pectoris: Secondary | ICD-10-CM

## 2015-04-02 DIAGNOSIS — I10 Essential (primary) hypertension: Secondary | ICD-10-CM | POA: Diagnosis not present

## 2015-04-02 LAB — MYOCARDIAL PERFUSION IMAGING
LV dias vol: 168 mL
LV sys vol: 102 mL
Peak HR: 83 {beats}/min
RATE: 0.35
Rest HR: 51 {beats}/min
SDS: 2
SRS: 7
SSS: 9
TID: 0.98

## 2015-04-02 MED ORDER — TECHNETIUM TC 99M SESTAMIBI GENERIC - CARDIOLITE
10.2000 | Freq: Once | INTRAVENOUS | Status: AC | PRN
  Administered 2015-04-02: 10 via INTRAVENOUS

## 2015-04-02 MED ORDER — REGADENOSON 0.4 MG/5ML IV SOLN
0.4000 mg | Freq: Once | INTRAVENOUS | Status: AC
  Administered 2015-04-02: 0.4 mg via INTRAVENOUS

## 2015-04-02 MED ORDER — TECHNETIUM TC 99M SESTAMIBI GENERIC - CARDIOLITE
32.3000 | Freq: Once | INTRAVENOUS | Status: AC | PRN
  Administered 2015-04-02: 32.3 via INTRAVENOUS

## 2015-04-04 ENCOUNTER — Telehealth: Payer: Self-pay | Admitting: *Deleted

## 2015-04-04 NOTE — Telephone Encounter (Signed)
ERROR

## 2015-04-09 ENCOUNTER — Other Ambulatory Visit: Payer: Self-pay | Admitting: Urology

## 2015-04-18 ENCOUNTER — Ambulatory Visit (HOSPITAL_COMMUNITY)
Admission: RE | Admit: 2015-04-18 | Discharge: 2015-04-18 | Disposition: A | Discharge status: Home / Self Care | Payer: Medicare HMO | Source: Ambulatory Visit | Attending: Urology | Admitting: Urology

## 2015-04-18 ENCOUNTER — Encounter (HOSPITAL_COMMUNITY): Payer: Self-pay

## 2015-04-18 ENCOUNTER — Encounter (HOSPITAL_COMMUNITY)
Admission: RE | Admit: 2015-04-18 | Discharge: 2015-04-18 | Disposition: A | Discharge status: Home / Self Care | Payer: Medicare HMO | Source: Ambulatory Visit | Attending: Urology | Admitting: Urology

## 2015-04-18 DIAGNOSIS — I2510 Atherosclerotic heart disease of native coronary artery without angina pectoris: Secondary | ICD-10-CM | POA: Insufficient documentation

## 2015-04-18 DIAGNOSIS — N471 Phimosis: Secondary | ICD-10-CM | POA: Diagnosis not present

## 2015-04-18 DIAGNOSIS — Z01810 Encounter for preprocedural cardiovascular examination: Secondary | ICD-10-CM

## 2015-04-18 DIAGNOSIS — I517 Cardiomegaly: Secondary | ICD-10-CM | POA: Insufficient documentation

## 2015-04-18 DIAGNOSIS — Z951 Presence of aortocoronary bypass graft: Secondary | ICD-10-CM | POA: Insufficient documentation

## 2015-04-18 HISTORY — DX: Gastro-esophageal reflux disease without esophagitis: K21.9

## 2015-04-18 HISTORY — DX: Frequency of micturition: R35.0

## 2015-04-18 HISTORY — DX: Unspecified cataract: H26.9

## 2015-04-18 HISTORY — DX: Reserved for inherently not codable concepts without codable children: IMO0001

## 2015-04-18 HISTORY — DX: Atherosclerotic heart disease of native coronary artery without angina pectoris: I25.10

## 2015-04-18 HISTORY — DX: Anesthesia of skin: R20.0

## 2015-04-18 HISTORY — DX: Unspecified osteoarthritis, unspecified site: M19.90

## 2015-04-18 HISTORY — DX: Edema, unspecified: R60.9

## 2015-04-18 HISTORY — DX: Nocturia: R35.1

## 2015-04-18 LAB — BASIC METABOLIC PANEL
ANION GAP: 7 (ref 5–15)
BUN: 12 mg/dL (ref 6–20)
CALCIUM: 9.2 mg/dL (ref 8.9–10.3)
CO2: 26 mmol/L (ref 22–32)
Chloride: 105 mmol/L (ref 101–111)
Creatinine, Ser: 1.01 mg/dL (ref 0.61–1.24)
GLUCOSE: 97 mg/dL (ref 65–99)
Potassium: 4.6 mmol/L (ref 3.5–5.1)
SODIUM: 138 mmol/L (ref 135–145)

## 2015-04-18 LAB — CBC
HCT: 44.4 % (ref 39.0–52.0)
Hemoglobin: 14.8 g/dL (ref 13.0–17.0)
MCH: 29.4 pg (ref 26.0–34.0)
MCHC: 33.3 g/dL (ref 30.0–36.0)
MCV: 88.1 fL (ref 78.0–100.0)
PLATELETS: 220 10*3/uL (ref 150–400)
RBC: 5.04 MIL/uL (ref 4.22–5.81)
RDW: 12.8 % (ref 11.5–15.5)
WBC: 4.7 10*3/uL (ref 4.0–10.5)

## 2015-04-18 NOTE — Patient Instructions (Signed)
Franklyn LorRobert E Ferch  04/18/2015   Your procedure is scheduled on: Monday April 22, 2015   [facility]  Entrance [place]  elevators to 3rd floor to  Short Stay Center at 2:00 PM.  Call this number if you have problems the morning of surgery [phone]   Remember: ONLY 1 PERSON MAY GO WITH YOU TO SHORT STAY TO GET  READY MORNING OF YOUR SURGERY.  Do not eat food After Midnight but may take clear liquids till 10:30 am day of surgery then nothing by mouth.      Take these medicines the morning of surgery with A SIP OF WATER: Omeprazole (Prilosec)                               You may not have any metal on your body including hair pins and              piercings  Do not wear jewelry, lotions, powders or colognes, deodorant                           Men may shave face and neck.   Do not bring valuables to the hospital. [facility] IS NOT             RESPONSIBLE   FOR VALUABLES.  Contacts, dentures or bridgework may not be worn into surgery.      Patients discharged the day of surgery will not be allowed to drive home.  Name and phone number of your driver:Ed Humble (stepson) or Shela NevinRebecca Sluder (wife)  _____________________________________________________________________             [facility] - Preparing for [facility] Before surgery, you can play an important role.  Because skin is not sterile, your skin needs to be as free of germs as possible.  You can reduce the number of germs on your skin by washing with CHG (chlorahexidine gluconate) soap before surgery.  CHG is an antiseptic cleaner which kills germs and bonds with the skin to continue killing germs even after washing. Please DO NOT use if you have an allergy to CHG or antibacterial soaps.  If your skin becomes reddened/irritated stop using the CHG and inform your nurse when you arrive at Short Stay. Do not shave (including legs and underarms) for at least 48 hours prior to the first CHG  shower.  You may shave your face/neck. Please follow these instructions carefully:  1.  Shower with CHG Soap the night before surgery and the  morning of Surgery.  2.  If you choose to wash your hair, wash your hair first as usual with your  normal  shampoo.  3.  After you shampoo, rinse your hair and body thoroughly to remove the  shampoo.                           4.  Use CHG as you would any other liquid soap.  You can apply chg directly  to the skin and wash                       Gently with a scrungie or clean washcloth.  5.  Apply the CHG Soap to your body ONLY FROM THE NECK  DOWN.   Do not use on face/ open                           Wound or open sores. Avoid contact with eyes, ears mouth and genitals (private parts).                       Wash face,  Genitals (private parts) with your normal soap.             6.  Wash thoroughly, paying special attention to the area where your surgery  will be performed.  7.  Thoroughly rinse your body with warm water from the neck down.  8.  DO NOT shower/wash with your normal soap after using and rinsing off  the CHG Soap.                9.  Pat yourself dry with a clean towel.            10.  Wear clean pajamas.            11.  Place clean sheets on your bed the night of your first shower and do not  sleep with pets. Day of Surgery : Do not apply any lotions/deodorants the morning of surgery.  Please wear clean clothes to the hospital/surgery center.  FAILURE TO FOLLOW THESE INSTRUCTIONS MAY RESULT IN THE CANCELLATION OF YOUR SURGERY PATIENT SIGNATURE_________________________________  NURSE SIGNATURE__________________________________  ________________________________________________________________________    CLEAR LIQUID DIET   Foods Allowed                                                                     Foods Excluded  Coffee and tea, regular and decaf                             liquids that you cannot  Plain Jell-O in any flavor                                              see through such as: Fruit ices (not with fruit pulp)                                     milk, soups, orange juice  Iced Popsicles                                    All solid food Carbonated beverages, regular and diet                                    Cranberry, grape and apple juices Sports drinks like Gatorade Lightly seasoned clear broth or consume(fat free) Sugar, honey syrup  Sample Menu Breakfast  Lunch                                     Supper Cranberry juice                    Beef broth                            Chicken broth Jell-O                                     Grape juice                           Apple juice Coffee or tea                        Jell-O                                      Popsicle                                                Coffee or tea                        Coffee or tea  _____________________________________________________________________

## 2015-04-18 NOTE — H&P (Signed)
  Chief Complaint Phimosis   History of Present Illness [name] is a 69 year old gentleman who is uncircumcised. He is unable to retract his foreskin and does have some discomfort and irritation of the foreskin.  He is not diabetic. He notes no other modifying factors.   Past Medical History Problems  1. History of myocardial infarction (I25.2)  Surgical History Problems  1. History of Heart Surgery 2. History of Hernia Repair 3. History of Oral Surgery Tooth Extraction  Current Meds 1. Aspir-81 81 MG Oral Tablet Delayed Release;  Therapy: (Recorded:02Nov2016) to Recorded 2. Benicar HCT 20-12.5 MG Oral Tablet;  Therapy: (Recorded:02Nov2016) to Recorded 3. Crestor 10 MG Oral Tablet (Rosuvastatin Calcium);  Therapy: (Recorded:02Nov2016) to Recorded 4. Fish Oil Concentrate 1000 MG Oral Capsule;  Therapy: (Recorded:02Nov2016) to Recorded  Allergies Medication  1. Codeine Derivatives  Family History Problems  1. Family history of myocardial infarction (Z82.49) : Mother, Father  Social History Problems    Denied: History of Alcohol use   Former smoker [phone](Z87.891)   Married  Review of Systems Genitourinary, constitutional, skin, eye, otolaryngeal, hematologic/lymphatic, cardiovascular, pulmonary, endocrine, musculoskeletal, gastrointestinal, neurological and psychiatric system(s) were reviewed and pertinent findings if present are noted and are otherwise negative.  Constitutional: no fever.  Respiratory: shortness of breath during exertion.    Vitals  Height: 6 ft 1 in Weight: 250 lb  BMI Calculated: 32.98 BSA Calculated: 2.37   Physical Exam Constitutional: Well nourished and well developed . No acute distress.  ENT:. The ears and nose are normal in appearance.  Neck: The appearance of the neck is normal and no neck mass is present.  Pulmonary: No respiratory distress, normal respiratory rhythm and effort and clear bilateral breath sounds.  Cardiovascular:  Heart rate and rhythm are normal . No peripheral edema. Medial sternotomy scar(s).  Abdomen: The abdomen is soft and nontender. No masses are palpated. No CVA tenderness. No hernias are palpable. No hepatosplenomegaly noted.   Assessment Assessed  1. Phimosis (N47.1)    Discussion/Summary 1. Severe phimosis: He would like to proceed with a circumcision. We discussed this procedure in detail including the potential risks, complications, and expected recovery process. He expresses understanding and gives informed consent to proceed.

## 2015-04-18 NOTE — Progress Notes (Addendum)
Spoke with Dr Leta JunglingEwell / anesthesia in regards to pts H&P, EKG and ECHO results. No orders given. Anesthesia to see pt day of surgery.  Clearance per Dr Eden EmmsNishan / cardiology per epic 04/03/2015 discussed with stress test results 04/02/2015  Stress test per epic 04/02/2015 ECHO / epic 03/26/2015 EKG epic 03/21/2015

## 2015-04-22 ENCOUNTER — Ambulatory Visit (HOSPITAL_COMMUNITY): Payer: Medicare HMO | Admitting: Anesthesiology

## 2015-04-22 ENCOUNTER — Encounter (HOSPITAL_COMMUNITY)
Admission: RE | Disposition: A | Discharge status: Home / Self Care | Payer: Self-pay | Source: Ambulatory Visit | Attending: Urology

## 2015-04-22 ENCOUNTER — Ambulatory Visit (HOSPITAL_COMMUNITY)
Admission: RE | Admit: 2015-04-22 | Discharge: 2015-04-22 | Disposition: A | Discharge status: Home / Self Care | Payer: Medicare HMO | Source: Ambulatory Visit | Attending: Urology | Admitting: Urology

## 2015-04-22 ENCOUNTER — Encounter (HOSPITAL_COMMUNITY): Payer: Self-pay | Admitting: *Deleted

## 2015-04-22 DIAGNOSIS — I10 Essential (primary) hypertension: Secondary | ICD-10-CM | POA: Diagnosis not present

## 2015-04-22 DIAGNOSIS — Z955 Presence of coronary angioplasty implant and graft: Secondary | ICD-10-CM | POA: Diagnosis not present

## 2015-04-22 DIAGNOSIS — M1990 Unspecified osteoarthritis, unspecified site: Secondary | ICD-10-CM | POA: Insufficient documentation

## 2015-04-22 DIAGNOSIS — I739 Peripheral vascular disease, unspecified: Secondary | ICD-10-CM | POA: Insufficient documentation

## 2015-04-22 DIAGNOSIS — Z79899 Other long term (current) drug therapy: Secondary | ICD-10-CM | POA: Insufficient documentation

## 2015-04-22 DIAGNOSIS — I252 Old myocardial infarction: Secondary | ICD-10-CM | POA: Diagnosis not present

## 2015-04-22 DIAGNOSIS — N471 Phimosis: Secondary | ICD-10-CM | POA: Diagnosis present

## 2015-04-22 DIAGNOSIS — N480 Leukoplakia of penis: Secondary | ICD-10-CM | POA: Insufficient documentation

## 2015-04-22 DIAGNOSIS — Z7982 Long term (current) use of aspirin: Secondary | ICD-10-CM | POA: Insufficient documentation

## 2015-04-22 DIAGNOSIS — I2510 Atherosclerotic heart disease of native coronary artery without angina pectoris: Secondary | ICD-10-CM | POA: Diagnosis not present

## 2015-04-22 DIAGNOSIS — K219 Gastro-esophageal reflux disease without esophagitis: Secondary | ICD-10-CM | POA: Diagnosis not present

## 2015-04-22 DIAGNOSIS — Z87891 Personal history of nicotine dependence: Secondary | ICD-10-CM | POA: Diagnosis not present

## 2015-04-22 HISTORY — PX: CIRCUMCISION: SHX1350

## 2015-04-22 SURGERY — CIRCUMCISION, ADULT
Anesthesia: General

## 2015-04-22 MED ORDER — CEFAZOLIN SODIUM-DEXTROSE 2-3 GM-% IV SOLR
2.0000 g | INTRAVENOUS | Status: AC
  Administered 2015-04-22: 2 g via INTRAVENOUS

## 2015-04-22 MED ORDER — PROPOFOL 10 MG/ML IV BOLUS
INTRAVENOUS | Status: DC | PRN
  Administered 2015-04-22: 180 mg via INTRAVENOUS

## 2015-04-22 MED ORDER — HYDROCODONE-ACETAMINOPHEN 5-325 MG PO TABS: 1.0000 | ORAL_TABLET | Freq: Four times a day (QID) | ORAL | Status: AC | PRN

## 2015-04-22 MED ORDER — DEXAMETHASONE SODIUM PHOSPHATE 10 MG/ML IJ SOLN
INTRAMUSCULAR | Status: DC | PRN
  Administered 2015-04-22: 10 mg via INTRAVENOUS

## 2015-04-22 MED ORDER — ONDANSETRON HCL 4 MG/2ML IJ SOLN
INTRAMUSCULAR | Status: AC
  Filled 2015-04-22: qty 2

## 2015-04-22 MED ORDER — BUPIVACAINE HCL (PF) 0.25 % IJ SOLN
INTRAMUSCULAR | Status: AC
  Filled 2015-04-22: qty 30

## 2015-04-22 MED ORDER — ONDANSETRON HCL 4 MG/2ML IJ SOLN
INTRAMUSCULAR | Status: DC | PRN
  Administered 2015-04-22: 4 mg via INTRAVENOUS

## 2015-04-22 MED ORDER — FENTANYL CITRATE (PF) 100 MCG/2ML IJ SOLN
INTRAMUSCULAR | Status: DC | PRN
  Administered 2015-04-22: 50 ug via INTRAVENOUS
  Administered 2015-04-22 (×2): 25 ug via INTRAVENOUS

## 2015-04-22 MED ORDER — MIDAZOLAM HCL 5 MG/5ML IJ SOLN
INTRAMUSCULAR | Status: DC | PRN
  Administered 2015-04-22: 2 mg via INTRAVENOUS

## 2015-04-22 MED ORDER — LIDOCAINE HCL (CARDIAC) 20 MG/ML IV SOLN
INTRAVENOUS | Status: AC
  Filled 2015-04-22: qty 5

## 2015-04-22 MED ORDER — DEXAMETHASONE SODIUM PHOSPHATE 10 MG/ML IJ SOLN
INTRAMUSCULAR | Status: AC
  Filled 2015-04-22: qty 1

## 2015-04-22 MED ORDER — PROPOFOL 10 MG/ML IV BOLUS
INTRAVENOUS | Status: AC
  Filled 2015-04-22: qty 20

## 2015-04-22 MED ORDER — BUPIVACAINE HCL (PF) 0.25 % IJ SOLN
INTRAMUSCULAR | Status: DC | PRN
  Administered 2015-04-22: 10 mL

## 2015-04-22 MED ORDER — FENTANYL CITRATE (PF) 100 MCG/2ML IJ SOLN
INTRAMUSCULAR | Status: AC
  Filled 2015-04-22: qty 2

## 2015-04-22 MED ORDER — LIDOCAINE HCL (CARDIAC) 20 MG/ML IV SOLN
INTRAVENOUS | Status: DC | PRN
  Administered 2015-04-22: 50 mg via INTRAVENOUS

## 2015-04-22 MED ORDER — CEFAZOLIN SODIUM-DEXTROSE 2-3 GM-% IV SOLR
INTRAVENOUS | Status: AC
  Filled 2015-04-22: qty 50

## 2015-04-22 MED ORDER — LACTATED RINGERS IV SOLN
INTRAVENOUS | Status: DC
  Administered 2015-04-22: 1000 mL via INTRAVENOUS

## 2015-04-22 MED ORDER — MIDAZOLAM HCL 2 MG/2ML IJ SOLN
INTRAMUSCULAR | Status: AC
  Filled 2015-04-22: qty 2

## 2015-04-22 SURGICAL SUPPLY — 21 items
BLADE SURG 15 STRL LF DISP TIS (BLADE) ×1 IMPLANT
BLADE SURG 15 STRL SS (BLADE) ×3
BNDG COHESIVE 1X5 TAN STRL LF (GAUZE/BANDAGES/DRESSINGS) ×3 IMPLANT
COVER SURGICAL LIGHT HANDLE (MISCELLANEOUS) ×1 IMPLANT
DRAPE LAPAROTOMY T 98X78 PEDS (DRAPES) ×2 IMPLANT
ELECT PENCIL ROCKER SW 15FT (MISCELLANEOUS) ×3 IMPLANT
ELECT REM PT RETURN 9FT ADLT (ELECTROSURGICAL) ×3
ELECTRODE REM PT RTRN 9FT ADLT (ELECTROSURGICAL) ×1 IMPLANT
GAUZE PETROLATUM 1 X8 (GAUZE/BANDAGES/DRESSINGS) ×3 IMPLANT
GAUZE VASELINE 1X8 (GAUZE/BANDAGES/DRESSINGS) ×2 IMPLANT
GLOVE BIOGEL M STRL SZ7.5 (GLOVE) ×3 IMPLANT
GOWN STRL REUS W/TWL LRG LVL3 (GOWN DISPOSABLE) ×3 IMPLANT
KIT BASIN OR (CUSTOM PROCEDURE TRAY) ×3 IMPLANT
NS IRRIG 1000ML POUR BTL (IV SOLUTION) IMPLANT
PACK BASIC VI WITH GOWN DISP (CUSTOM PROCEDURE TRAY) ×3 IMPLANT
SUT CHROMIC 3 0 PS 2 (SUTURE) IMPLANT
SUT CHROMIC 3 0 SH 27 (SUTURE) ×6 IMPLANT
SUT SILK 2 0 (SUTURE) ×3
SUT SILK 2-0 18XBRD TIE 12 (SUTURE) IMPLANT
SYR CONTROL 10ML LL (SYRINGE) ×2 IMPLANT
WATER STERILE IRR 1500ML POUR (IV SOLUTION) IMPLANT

## 2015-04-22 NOTE — Anesthesia Postprocedure Evaluation (Signed)
Anesthesia Post Note  Patient: Timothy Cruz  Procedure(s) Performed: Procedure(s) (LRB): CIRCUMCISION ADULT (N/A)  Patient location during evaluation: PACU Anesthesia Type: General Level of consciousness: awake, sedated, oriented and patient cooperative Pain management: pain level controlled Vital Signs Assessment: post-procedure vital signs reviewed and stable Respiratory status: spontaneous breathing and respiratory function stable Cardiovascular status: stable Anesthetic complications: no    Last Vitals:  Filed Vitals:   04/22/15 1930 04/22/15 1955  BP: 140/77 139/87  Pulse: 66 67  Temp:  36.5 C  Resp: 17 18    Last Pain:  Filed Vitals:   04/22/15 1956  PainSc: 0-No pain                 Trentin Knappenberger EDWARD

## 2015-04-22 NOTE — Anesthesia Procedure Notes (Signed)
Procedure Name: LMA Insertion Date/Time: 04/22/2015 5:56 PM Performed by: Enriqueta ShutterWILLIFORD, Jaonna Word D Pre-anesthesia Checklist: Patient identified, Emergency Drugs available, Suction available and Patient being monitored Patient Re-evaluated:Patient Re-evaluated prior to inductionOxygen Delivery Method: Circle System Utilized Preoxygenation: Pre-oxygenation with 100% oxygen Intubation Type: IV induction Ventilation: Mask ventilation without difficulty LMA: LMA inserted LMA Size: 5.0 Tube type: Oral Number of attempts: 1 Placement Confirmation: positive ETCO2 and breath sounds checked- equal and bilateral Tube secured with: Tape Dental Injury: Teeth and Oropharynx as per pre-operative assessment

## 2015-04-22 NOTE — Discharge Instructions (Signed)
1. You may carefully remove your bandage tomorrow evening. 2. Apply neosporin or bacitracin to the incision area twice daily around the penis. 3. Do not engage in sexual activity until 6 weeks after surgery. 4. Call if excessive bleeding is noted.

## 2015-04-22 NOTE — Op Note (Signed)
Preoperative diagnosis:  1. Phimosis  Postoperative diagnosis: 1. Phimosis  Procedure(s): 1. Circumcision  Surgeon: Dr. Rolly SalterLester S. Denishia Citro, Jr  Anesthesia: General  Complications: None  EBL: 50 cc  Specimens: 1. Prepuce  Disposition of specimens: Pathology  Intraoperative findings: The patient was noted to have severe phimosis with an inability to retract the foreskin at all.  Indication: Mr. Ronalee RedMichaux is a 69 year old gentleman with severe phimosis.  He presents today for a circumcision after undergoing a preoperative evaluation and counseling electing to proceed with surgical treatment.  The potential risks, complications, and alternative options have been discussed in detail.  We have also discussed expected postoperative recovery process.  He gives informed consent to proceed.  Description of procedure:  The patient was taken to the operating room and a general anesthetic was administered.  He was given preoperative antibiotics, placed in the supine position, and prepped and draped in the usual sterile fashion.  Next, a preoperative timeout was performed.  The penis was examined and the patient was noted to have severe phimosis with a complete inability to retract the foreskin. I therefore performed a dorsal slit utilizing a hemostat to compress the foreskin dorsally.  The foreskin was then divided sharply with Metzenbaum scissors.  This allowed the glans penis to finally be exposed.  Additional Betadine was applied to the glans penis and inner aspect of the prepuce.  Circumferential incisions were then marked proximally and distally.  A sharp 15 blade was then used to make a skin incision at both the proximal and distal incision sites.  Dissection was then performed through the dartos layer under the skin dorsally. The skin was then divided. The remaining foreskin was then removed with Bovie electrocautery.  Hemostasis was achieved with point electrocautery.  The skin edges were then  reapproximated in 4 quadrants with interrupted 3-0 chromic sutures. Each quadrant was then reapproximated with a running 3-0 chromic suture.  A dorsal penile block was then performed with 10 cc of quarter percent Marcaine.  A sterile dressing was then applied and including Vaseline gauze with Coband to secure the gauze in place. The patient tolerated the procedure well without complications.  He was able to be awakened and transferred to the recovery unit in satisfactory condition.

## 2015-04-22 NOTE — Anesthesia Preprocedure Evaluation (Addendum)
Anesthesia Evaluation  Patient identified by MRN, date of birth, ID band Patient awake    Reviewed: Allergy & Precautions, NPO status , Patient's Chart, lab work & pertinent test results  Airway Mallampati: II  TM Distance: >3 FB     Dental   Pulmonary former smoker,    Pulmonary exam normal + rhonchi  + decreased breath sounds      Cardiovascular hypertension, Pt. on medications + CAD, + Past MI, + CABG and + Peripheral Vascular Disease  Normal cardiovascular exam  Coronary artery bypass grafting x4 with the left internal mammary artery to the left anterior descending coronary artery, reversed saphenous vein graft to the diagonal coronary artery, sequential reversed saphenous vein  graft to the first and second obtuse marginals.   Neuro/Psych negative neurological ROS  negative psych ROS   GI/Hepatic Neg liver ROS, GERD  Medicated,  Endo/Other  negative endocrine ROS  Renal/GU negative Renal ROS  negative genitourinary   Musculoskeletal  (+) Arthritis ,   Abdominal   Peds negative pediatric ROS (+)  Hematology negative hematology ROS (+)   Anesthesia Other Findings   Reproductive/Obstetrics negative OB ROS                         Lab Results  Component Value Date   WBC 4.7 04/18/2015   HGB 14.8 04/18/2015   HCT 44.4 04/18/2015   MCV 88.1 04/18/2015   PLT 220 04/18/2015   Lab Results  Component Value Date   CREATININE 1.01 04/18/2015   BUN 12 04/18/2015   NA 138 04/18/2015   K 4.6 04/18/2015   CL 105 04/18/2015   CO2 26 04/18/2015   No results found for: INR, PROTIME  03/2015: EKG: normal sinus rhythm.  03/2015: Echo - Left ventricle: The cavity size was normal. Wall thickness was normal. Systolic function was mildly reduced. The estimated ejection fraction was in the range of 45% to 50%. The anterior wall is thinned and echogenic, suggestive of prior scar.  Doppler parameters are consistent with abnormal left ventricular relaxation (grade 1 diastolic dysfunction). The E/e&' ratio is between 8-15, suggesting indeterminate LV filling pressure. - Left atrium: The atrium was normal in size. - Atrial septum: No defect or patent foramen ovale was identified.   Anesthesia Physical Anesthesia Plan  ASA: III  Anesthesia Plan: General   Post-op Pain Management:    Induction: Intravenous  Airway Management Planned: LMA  Additional Equipment:   Intra-op Plan:   Post-operative Plan: Extubation in OR  Informed Consent: I have reviewed the patients History and Physical, chart, labs and discussed the procedure including the risks, benefits and alternatives for the proposed anesthesia with the patient or authorized representative who has indicated his/her understanding and acceptance.   Dental advisory given  Plan Discussed with: CRNA, Anesthesiologist and Surgeon  Anesthesia Plan Comments:        Anesthesia Quick Evaluation

## 2015-04-22 NOTE — Transfer of Care (Signed)
Immediate Anesthesia Transfer of Care Note  Patient: Timothy Cruz  Procedure(s) Performed: Procedure(s): CIRCUMCISION ADULT (N/A)  Patient Location: PACU  Anesthesia Type:General  Level of Consciousness:  sedated, patient cooperative and responds to stimulation  Airway & Oxygen Therapy:Patient Spontanous Breathing and Patient connected to face mask oxgen  Post-op Assessment:  Report given to PACU RN and Post -op Vital signs reviewed and stable  Post vital signs:  Reviewed and stable  Last Vitals:  Filed Vitals:   04/22/15 1348  BP: 152/86  Pulse: 67  Temp: 36.3 C  Resp: 18    Complications: No apparent anesthesia complications

## 2015-04-23 ENCOUNTER — Encounter (HOSPITAL_COMMUNITY): Payer: Self-pay | Admitting: Urology

## 2015-04-23 NOTE — Addendum Note (Signed)
Addendum  created 04/23/15 [id number] by Elyn PeersSandra J Chanteria Haggard, CRNA   Modules edited: Charges VN

## 2015-06-17 NOTE — Progress Notes (Signed)
Patient ID: Timothy Cruz, male   DOB: [birth year], 70 y.o.   MRN: [id number]     Cardiology Office Note   Date:  06/17/2015   ID:  Timothy Cruz, DOB 11-Jul-1945, MRN [id number]  PCP:  Ralene Ok, MD  Cardiologist:   Charlton Haws, MD   No chief complaint on file.     History of Present Illness: Timothy Cruz is a 70 y.o. male who presents for evaluation of CAD.  History of CABG 2003 by Dr Tyrone Sage  Presented with anterior MI.  Dr Riley Kill felt ostial nature Of LAD disease warranted surgery  EF at time of presentatoin was 37%   04/18/2002 PREOPERATIVE DIAGNOSIS: Acute anterior myocardial infarction and coronary occlusive disease.  POSTOPERATIVE DIAGNOSIS: Acute anterior myocardial infarction and coronary occlusive disease.  PROCEDURES: 1. Coronary artery bypass grafting x4 with the left internal mammary artery  to the left anterior descending coronary artery, reversed saphenous vein  graft to the diagonal coronary artery, sequential reversed saphenous vein  graft to the first and second obtuse marginals. 2. Right endovein harvesting.  SURGEON: Gwenith Daily. Tyrone Sage, M.D.  04/22/15 had Phimosis surgery under general anesthesia by Dr Illa Level No cardiac complications pre op myovue reviewed and non ischemic 04/02/15    Nuclear stress EF: 39%.  There was no ST segment deviation noted during stress.  This is an intermediate risk study.  The left ventricular ejection fraction is moderately decreased (30-44%).  Intermediate risk stress nuclear study with large, severe, fixed anterior, apical and inferior defects consistent with prior infarct; no ischemia; EF 39 with global hypokinesis; moderate LVE.  Echo reviewed 03/26/15 EF 45-50%   Past Medical History  Diagnosis Date  . MI (myocardial infarction) (HCC)   . Coronary artery disease   . Edema     lower extermity bilat   . Shortness of breath dyspnea     pt states has to stop when climbing  stairs   . Numbness     hands bilat   . Cataracts, bilateral   . Urinary frequency   . Nocturia   . GERD (gastroesophageal reflux disease)   . Arthritis     Past Surgical History  Procedure Laterality Date  . Cardiac surgery    . Hernia repair    . Oral tooth surgery    . Cardiac catheterization    . Coronary artery bypass graft      4 bypass   . Circumcision N/A 04/22/2015    Procedure: CIRCUMCISION ADULT;  Surgeon: Heloise Purpura, MD;  Location: [facility] ORS;  Service: Urology;  Laterality: N/A;     Current Outpatient Prescriptions  Medication Sig Dispense Refill  . HYDROcodone-acetaminophen (NORCO/VICODIN) 5-325 MG tablet Take 1-2 tablets by mouth every 6 (six) hours as needed. 25 tablet 0  . olmesartan (BENICAR) 20 MG tablet Take 20 mg by mouth daily.  2  . omeprazole (PRILOSEC) 20 MG capsule Take 20 mg by mouth daily.  5  . rosuvastatin (CRESTOR) 20 MG tablet Take 20 mg by mouth daily.  2   No current facility-administered medications for this visit.    Allergies:   Codeine    Social History:  The patient  reports that he quit smoking about 14 years ago. His smoking use included Cigarettes. He has a 90 pack-year smoking history. He quit smokeless tobacco use about 14 years ago. His smokeless tobacco use included Chew. He reports that he does not drink alcohol or use illicit drugs.   Family History:  The  patient's family history includes Heart attack in his father and mother.    ROS:  Please see the history of present illness.   Otherwise, review of systems are positive for none.   All other systems are reviewed and negative.    PHYSICAL EXAM: VS:  There were no vitals taken for this visit. , BMI There is no weight on file to calculate BMI. Affect appropriate Overweight white male  HEENT: normal Neck supple with no adenopathy JVP normal no bruits no thyromegaly Lungs clear with no wheezing and good diaphragmatic motion Heart:  S1/S2 no murmur, no rub, gallop or  click PMI normal Abdomen: benighn, BS positve, no tenderness, no AAA no bruit.  No HSM or HJR Distal pulses intact with no bruits No edema Neuro non-focal Skin warm and dry No muscular weakness    EKG:   SR rate 84  Voltage LVH limb leads poor R wve progression no acute changes   Recent Labs: 04/18/2015: BUN 12; Creatinine, Ser 1.01; Hemoglobin 14.8; Platelets 220; Potassium 4.6; Sodium 138    Lipid Panel No results found for: CHOL, TRIG, HDL, CHOLHDL, VLDL, LDLCALC, LDLDIRECT    Wt Readings from Last 3 Encounters:  04/22/15 109.43 kg (241 lb 4 oz)  04/18/15 109.43 kg (241 lb 4 oz)  04/02/15 110.224 kg (243 lb)      Other studies Reviewed: Additional studies/ records that were reviewed today include: [facility] notes Dr Laverle Patter.    ASSESSMENT AND PLAN:  1. CAD/CAB G 2003  No angina myovue 04/02/15 non ischemic continue medical Rx 2. Ischemic DCM:  No active CHF last EF 39% by nuclear and 45-50% by echo 03/26/15    continue ACE/diuretic 3. Chol:  On statin labs with primary Dr Ludwig Clarks   Current medicines are reviewed at length with the patient today.  The patient does not have concerns regarding medicines.  The following changes have been made:  no change  Labs/ tests ordered today include:    No orders of the defined types were placed in this encounter.     Disposition:   FU with me 6 months     Signed, Charlton Haws, MD  06/17/2015 1:18 PM    [facility] [address] [place], [place], [place]  [address] Phone: [phone]; Fax: [phone]

## 2015-06-24 ENCOUNTER — Encounter: Payer: Medicare HMO | Admitting: Cardiovascular Disease

## 2015-08-30 ENCOUNTER — Other Ambulatory Visit: Payer: Self-pay | Admitting: Internal Medicine

## 2015-08-30 ENCOUNTER — Ambulatory Visit
Admission: RE | Admit: 2015-08-30 | Discharge: 2015-08-30 | Disposition: A | Discharge status: Home / Self Care | Payer: Medicare HMO | Source: Ambulatory Visit | Attending: Internal Medicine | Admitting: Internal Medicine

## 2015-08-30 DIAGNOSIS — M25562 Pain in left knee: Secondary | ICD-10-CM

## 2015-08-30 IMAGING — CR DG KNEE COMPLETE 4+V*L*
4 series · 4 of 4 positions shown · non-contrast
Comparison: None.

CLINICAL DATA: Left knee pain

EXAM:
LEFT KNEE - COMPLETE 4+ VIEW

[w knee ap left]
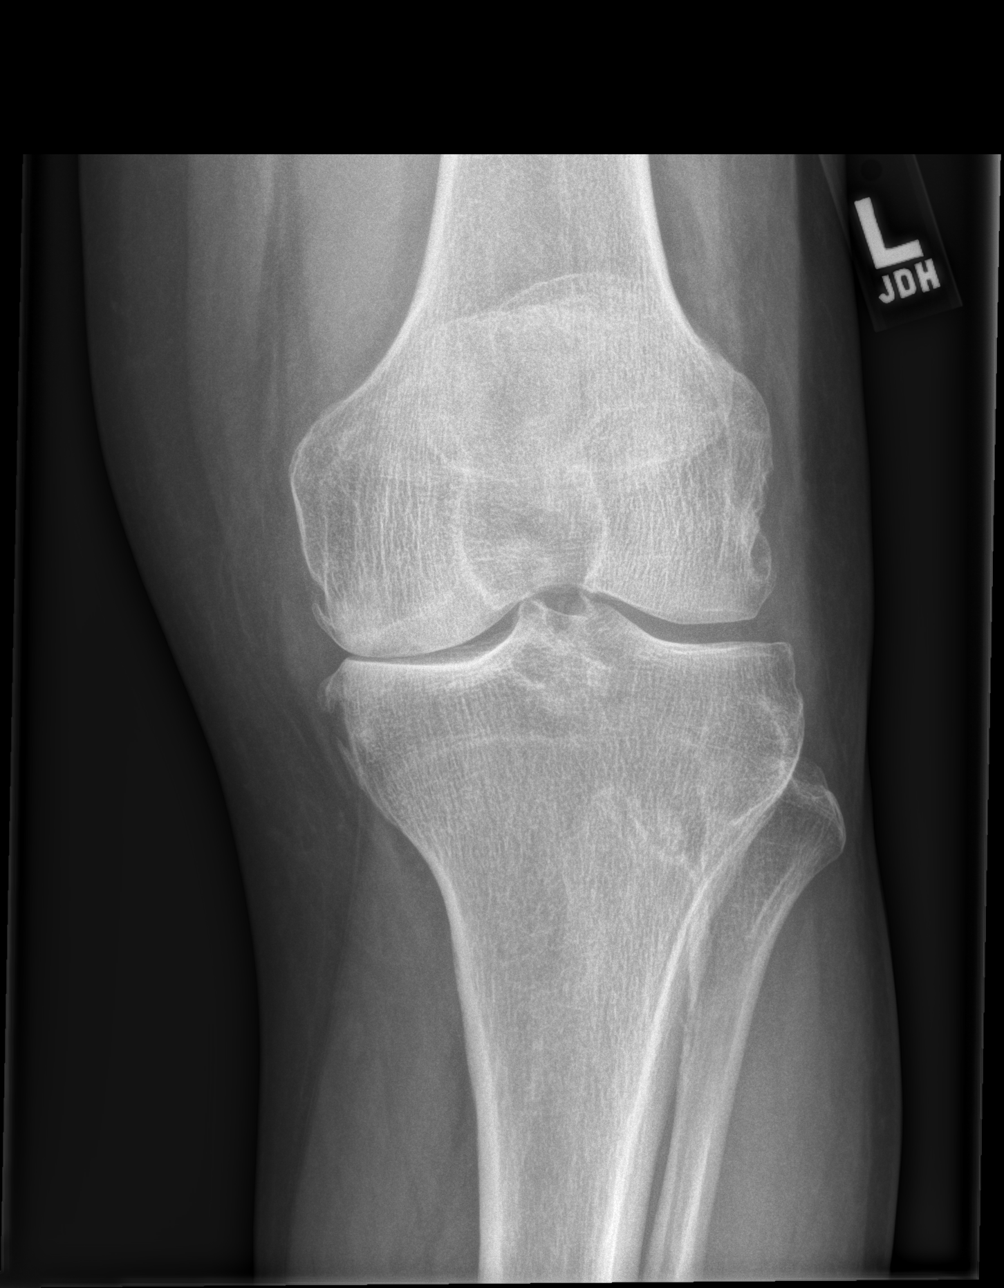

[w knee lat left]
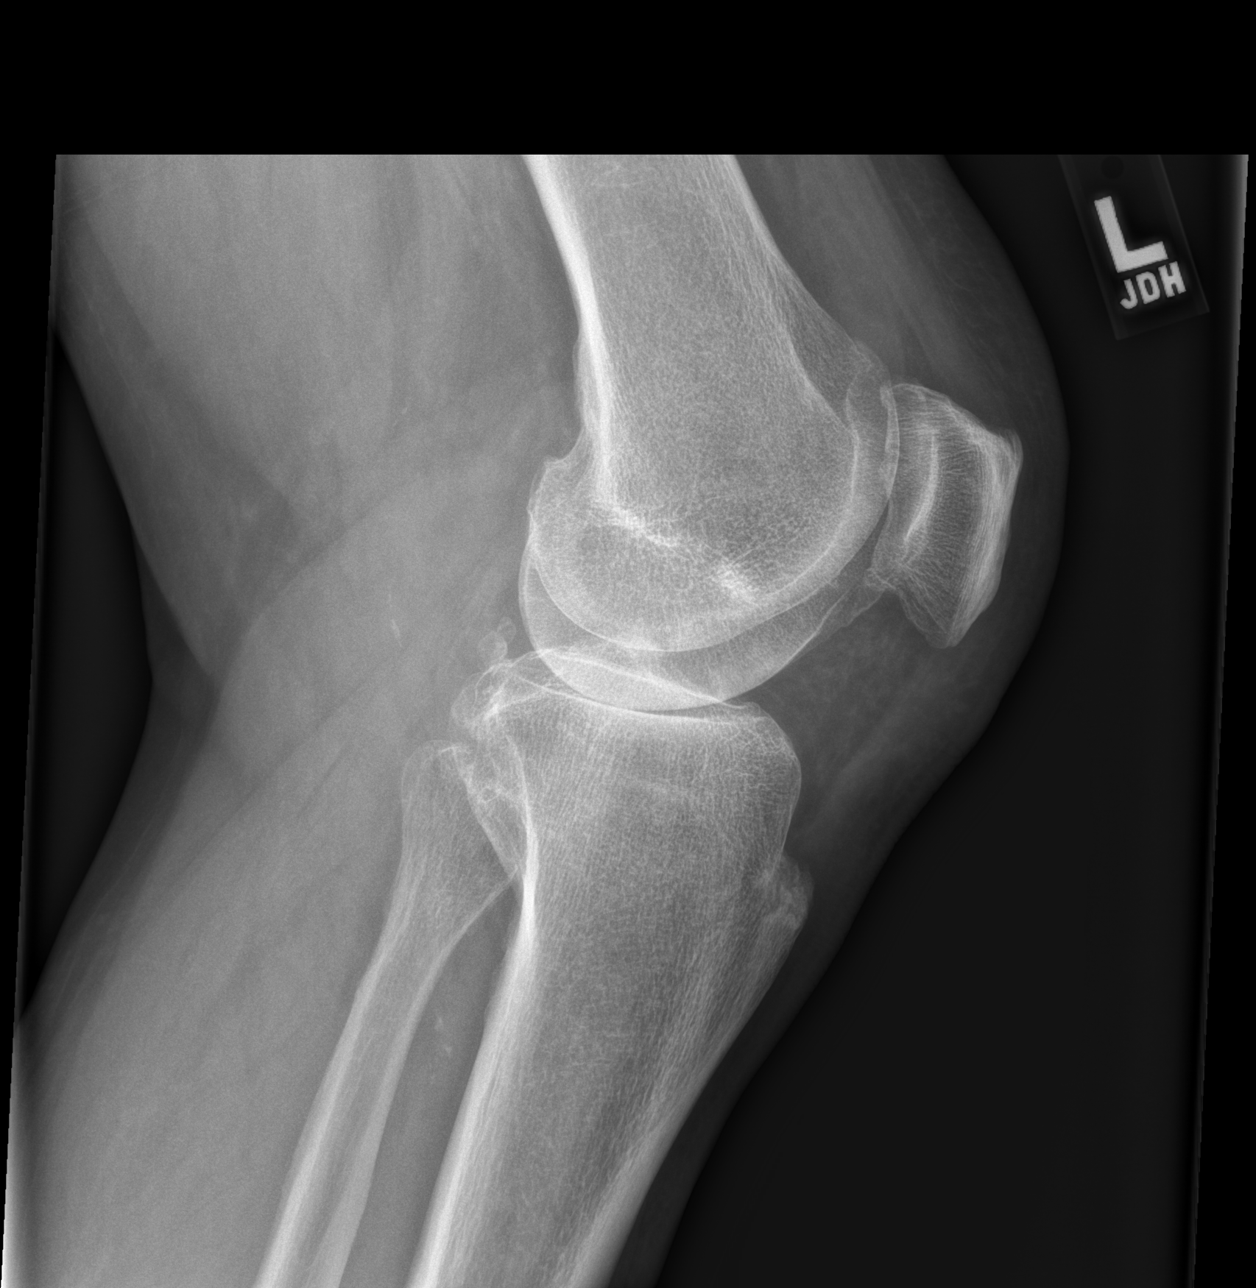

[x knee tunnel left]
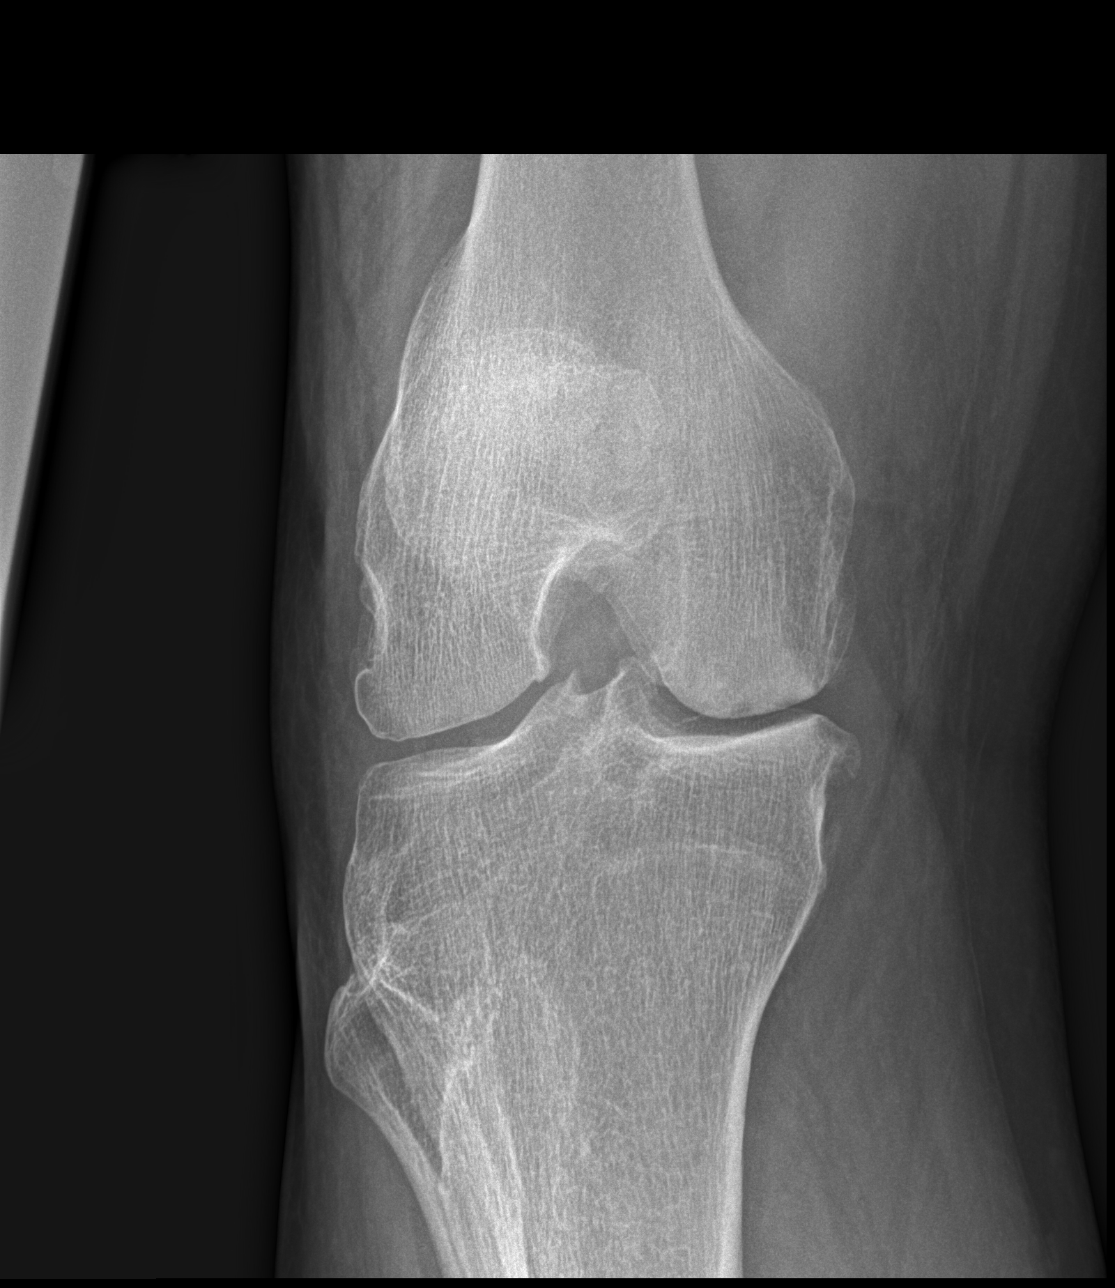

[x knee sunrise left]
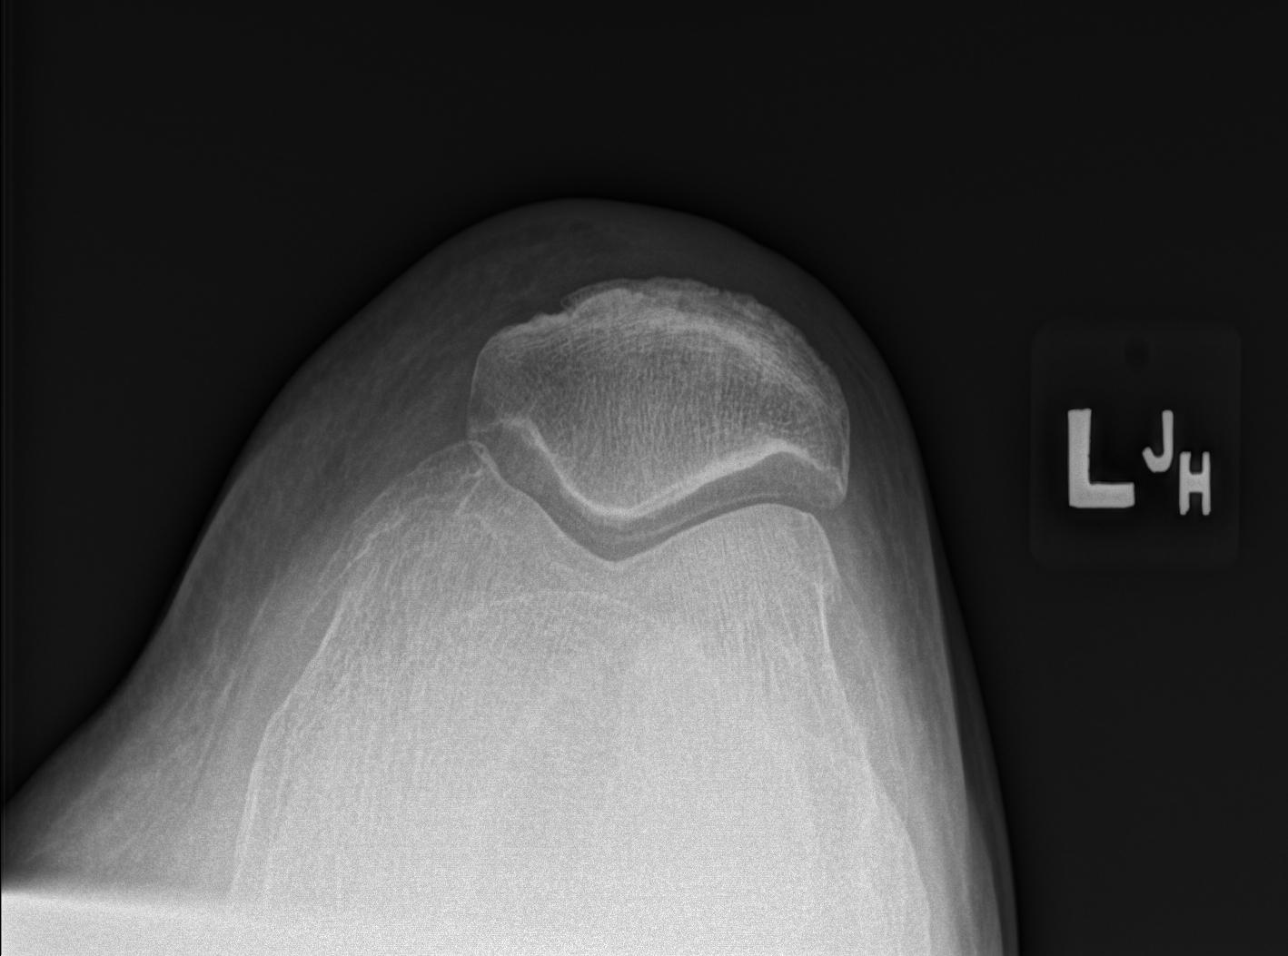

[4 of 4 positions shown; findings below may reference images not displayed]

FINDINGS: Four views of the left knee submitted. Significant narrowing of
medial joint compartment. There is spurring of medial femoral
condyle and medial tibial plateau. Significant narrowing of
patellofemoral joint space. Small joint effusion. No acute fracture
or subluxation. Mild spurring of patella.
IMPRESSION: No acute fracture or subluxation. Small joint effusion.
Osteoarthritic changes as described above.

## 2015-12-17 ENCOUNTER — Other Ambulatory Visit: Payer: Self-pay | Admitting: Internal Medicine

## 2015-12-17 DIAGNOSIS — M79606 Pain in leg, unspecified: Secondary | ICD-10-CM

## 2015-12-25 ENCOUNTER — Ambulatory Visit
Admission: RE | Admit: 2015-12-25 | Discharge: 2015-12-25 | Disposition: A | Discharge status: Home / Self Care | Payer: Medicare HMO | Source: Ambulatory Visit | Attending: Internal Medicine | Admitting: Internal Medicine

## 2015-12-25 DIAGNOSIS — M79606 Pain in leg, unspecified: Secondary | ICD-10-CM

## 2020-12-17 ENCOUNTER — Ambulatory Visit (INDEPENDENT_AMBULATORY_CARE_PROVIDER_SITE_OTHER): Payer: Medicare HMO

## 2020-12-17 ENCOUNTER — Encounter: Payer: Self-pay | Admitting: Orthopaedic Surgery

## 2020-12-17 ENCOUNTER — Ambulatory Visit: Payer: Medicare HMO | Admitting: Orthopaedic Surgery

## 2020-12-17 ENCOUNTER — Other Ambulatory Visit: Payer: Self-pay

## 2020-12-17 DIAGNOSIS — M25512 Pain in left shoulder: Secondary | ICD-10-CM | POA: Diagnosis not present

## 2020-12-17 DIAGNOSIS — G8929 Other chronic pain: Secondary | ICD-10-CM

## 2020-12-17 NOTE — Progress Notes (Signed)
Office Visit Note   Patient: Timothy Cruz           Date of Birth: [birth year]           MRN: [id number] Visit Date: 12/17/2020              Requested by: Ralene Ok, MD [id number] [place],  [place] [address] PCP: Ralene Ok, MD   Assessment & Plan: Visit Diagnoses:  1. Chronic left shoulder pain   2. Acute pain of left shoulder     Plan: Mr. Kimmey has been experiencing left shoulder pain for the last 3 weeks.  There is no history of injury or trauma.  He does have positive impingement and loss of the last 35 degrees of full extension compared to his right elbow.  X-rays were really nondiagnostic.  He certainly has evidence of a mild adhesive capsulitis.  I think a course of physical therapy be worthwhile.  He did not want to consider cortisone injection.  Would like to see him again in about a month and if still having a problem consider an MRI scan to rule out rotator cuff tear  Follow-Up Instructions: Return in about 1 month (around 01/17/2021).   Orders:  Orders Placed This Encounter  Procedures   XR Cervical Spine 2 or 3 views   XR Shoulder Left   Ambulatory referral to Physical Therapy   No orders of the defined types were placed in this encounter.     Procedures: No procedures performed   Clinical Data: No additional findings.   Subjective: Chief Complaint  Patient presents with   Left Shoulder - Pain  Patient presents today for left shoulder pain. He has been having pain for 2-3weeks. No known injury. He has pain throughout his shoulder and into his proximal arm. He states that he has some pain in his neck. He does experience some left hand numbness. He is right hand dominant. No previous shoulder surgery.   HPI  Review of Systems   Objective: Vital Signs: There were no vitals taken for this visit.  Physical Exam Constitutional:      Appearance: He is well-developed.  Pulmonary:     Effort: Pulmonary effort is normal.  Skin:    General:  Skin is warm and dry.  Neurological:     Mental Status: He is alert and oriented to person, place, and time.  Psychiatric:        Behavior: Behavior normal.    Ortho Exam awake alert and oriented x3.  Comfortable sitting in no acute distress.  He had very mild loss of motion of the cervical spine.  He was able to touch his chin to his chest and probably had about 60 to 70% of neck extension without referred pain to either upper extremity.  Some mild loss of rotation of the right and the left but no referred pain.  Has about 35 or so degrees lack of full left shoulder extension and some mild loss of motion and external rotation and even internal rotation consistent with adhesive capsulitis.  Biceps intact.  Speeds sign negative.  Mildly positive empty can test but no pain along the subacromial region anteriorly or laterally.  Good grip and release  Specialty Comments:  No specialty comments available.  Imaging: XR Cervical Spine 2 or 3 views  Result Date: 12/17/2020 Films of the cervical spine were obtained in 2 projections.  There is diffuse degenerative change from C3-C7.  At C5-6 there is more narrowing  of the disc space and both anterior and posterior osteophyte formation.  Straightening of the normal cervical lordosis.  No curvature or listhesis  XR Shoulder Left  Result Date: 12/17/2020 Films of the left shoulder obtained in 2 projections.  There is slight elevation of the humeral head and the glenoid but at least 8 mm of space between the humeral head and the acromion.  Some degenerative change at the [facility] joint and some bulky anterior acromial osteophytes.  No ectopic calcification or acute changes.  No obvious arthritic change of the glenohumeral joint.  Films might be consistent with a chronic rotator cuff insufficiency    PMFS History: Patient Active Problem List   Diagnosis Date Noted   Pain in left shoulder 12/17/2020   Past Medical History:  Diagnosis Date   Arthritis     Cataracts, bilateral    Coronary artery disease    Edema    lower extermity bilat    GERD (gastroesophageal reflux disease)    MI (myocardial infarction) (HCC)    Nocturia    Numbness    hands bilat    Shortness of breath dyspnea    pt states has to stop when climbing stairs    Urinary frequency     Family History  Problem Relation Age of Onset   Heart attack Mother    Heart attack Father     Past Surgical History:  Procedure Laterality Date   CARDIAC CATHETERIZATION     CARDIAC SURGERY     CIRCUMCISION N/A 04/22/2015   Procedure: CIRCUMCISION ADULT;  Surgeon: Heloise Purpura, MD;  Location: [facility];  Service: Urology;  Laterality: N/A;   CORONARY ARTERY BYPASS GRAFT     4 bypass    HERNIA REPAIR     oral tooth surgery     Social History   Occupational History   Not on file  Tobacco Use   Smoking status: Former    Packs/day: 3.00    Years: 30.00    Pack years: 90.00    Types: Cigarettes    Quit date: 05/04/2001    Years since quitting: 19.6   Smokeless tobacco: Former    Types: Chew    Quit date: 05/04/2001  Substance and Sexual Activity   Alcohol use: No   Drug use: No   Sexual activity: Not on file

## 2021-05-15 ENCOUNTER — Other Ambulatory Visit: Payer: Self-pay | Admitting: Internal Medicine

## 2021-05-15 DIAGNOSIS — I739 Peripheral vascular disease, unspecified: Secondary | ICD-10-CM

## 2021-05-22 ENCOUNTER — Ambulatory Visit
Admission: RE | Admit: 2021-05-22 | Discharge: 2021-05-22 | Disposition: A | Discharge status: Home / Self Care | Payer: Medicare Other | Source: Ambulatory Visit | Attending: Internal Medicine | Admitting: Internal Medicine

## 2021-05-22 DIAGNOSIS — I739 Peripheral vascular disease, unspecified: Secondary | ICD-10-CM
